# Patient Record
Sex: Female | Born: 1965 | Hispanic: No | Marital: Married | Smoking: Never smoker
Health system: Southern US, Community
[De-identification: ages and names within clinical notes are randomized; demographics above are authoritative.]

## PROBLEM LIST (undated history)

## (undated) DIAGNOSIS — G43909 Migraine, unspecified, not intractable, without status migrainosus: Secondary | ICD-10-CM

## (undated) DIAGNOSIS — C50919 Malignant neoplasm of unspecified site of unspecified female breast: Secondary | ICD-10-CM

## (undated) DIAGNOSIS — D649 Anemia, unspecified: Secondary | ICD-10-CM

## (undated) DIAGNOSIS — Z803 Family history of malignant neoplasm of breast: Secondary | ICD-10-CM

## (undated) DIAGNOSIS — J45909 Unspecified asthma, uncomplicated: Secondary | ICD-10-CM

## (undated) HISTORY — DX: Migraine, unspecified, not intractable, without status migrainosus: G43.909

## (undated) HISTORY — DX: Malignant neoplasm of unspecified site of unspecified female breast: C50.919

## (undated) HISTORY — DX: Family history of malignant neoplasm of breast: Z80.3

## (undated) HISTORY — DX: Unspecified asthma, uncomplicated: J45.909

---

## 2010-03-10 ENCOUNTER — Encounter: Admission: RE | Admit: 2010-03-10 | Discharge: 2010-03-10 | Payer: Self-pay | Admitting: Radiology

## 2010-05-20 ENCOUNTER — Other Ambulatory Visit: Admission: RE | Admit: 2010-05-20 | Discharge: 2010-05-20 | Payer: Self-pay | Admitting: Family Medicine

## 2010-05-20 ENCOUNTER — Encounter: Admission: RE | Admit: 2010-05-20 | Discharge: 2010-05-20 | Payer: Self-pay | Admitting: Family Medicine

## 2011-04-13 ENCOUNTER — Other Ambulatory Visit: Payer: Self-pay | Admitting: Family Medicine

## 2011-04-13 DIAGNOSIS — M542 Cervicalgia: Secondary | ICD-10-CM

## 2011-04-18 ENCOUNTER — Ambulatory Visit
Admission: RE | Admit: 2011-04-18 | Discharge: 2011-04-18 | Disposition: A | Discharge status: Home / Self Care | Payer: Managed Care, Other (non HMO) | Source: Ambulatory Visit | Attending: Family Medicine | Admitting: Family Medicine

## 2011-04-18 DIAGNOSIS — M542 Cervicalgia: Secondary | ICD-10-CM

## 2011-06-16 ENCOUNTER — Other Ambulatory Visit (HOSPITAL_COMMUNITY)
Admission: RE | Admit: 2011-06-16 | Discharge: 2011-06-16 | Disposition: A | Discharge status: Home / Self Care | Payer: Managed Care, Other (non HMO) | Source: Ambulatory Visit | Attending: Family Medicine | Admitting: Family Medicine

## 2011-06-16 ENCOUNTER — Other Ambulatory Visit: Payer: Self-pay | Admitting: Family Medicine

## 2011-06-16 DIAGNOSIS — Z113 Encounter for screening for infections with a predominantly sexual mode of transmission: Secondary | ICD-10-CM | POA: Insufficient documentation

## 2011-06-16 DIAGNOSIS — Z124 Encounter for screening for malignant neoplasm of cervix: Secondary | ICD-10-CM | POA: Insufficient documentation

## 2012-03-30 ENCOUNTER — Ambulatory Visit: Payer: BC Managed Care – PPO | Attending: Neurology | Admitting: *Deleted

## 2012-03-30 DIAGNOSIS — R42 Dizziness and giddiness: Secondary | ICD-10-CM | POA: Insufficient documentation

## 2012-03-30 DIAGNOSIS — IMO0001 Reserved for inherently not codable concepts without codable children: Secondary | ICD-10-CM | POA: Insufficient documentation

## 2012-04-04 ENCOUNTER — Ambulatory Visit: Payer: BC Managed Care – PPO | Admitting: Rehabilitative and Restorative Service Providers"

## 2013-06-12 ENCOUNTER — Other Ambulatory Visit: Payer: Self-pay | Admitting: Family Medicine

## 2013-06-12 DIAGNOSIS — R109 Unspecified abdominal pain: Secondary | ICD-10-CM

## 2013-06-19 ENCOUNTER — Ambulatory Visit
Admission: RE | Admit: 2013-06-19 | Discharge: 2013-06-19 | Disposition: A | Discharge status: Home / Self Care | Payer: BC Managed Care – PPO | Source: Ambulatory Visit | Attending: Family Medicine | Admitting: Family Medicine

## 2013-06-19 DIAGNOSIS — R109 Unspecified abdominal pain: Secondary | ICD-10-CM

## 2014-01-03 ENCOUNTER — Other Ambulatory Visit (HOSPITAL_COMMUNITY): Payer: Self-pay | Admitting: Family Medicine

## 2014-01-03 ENCOUNTER — Ambulatory Visit (HOSPITAL_COMMUNITY): Payer: 59 | Attending: Cardiology | Admitting: Radiology

## 2014-01-03 ENCOUNTER — Ambulatory Visit (INDEPENDENT_AMBULATORY_CARE_PROVIDER_SITE_OTHER): Payer: 59 | Admitting: Physician Assistant

## 2014-01-03 ENCOUNTER — Other Ambulatory Visit: Payer: Self-pay | Admitting: *Deleted

## 2014-01-03 ENCOUNTER — Other Ambulatory Visit: Payer: Self-pay | Admitting: Family Medicine

## 2014-01-03 ENCOUNTER — Encounter: Payer: BC Managed Care – PPO | Admitting: Physician Assistant

## 2014-01-03 ENCOUNTER — Ambulatory Visit
Admission: RE | Admit: 2014-01-03 | Discharge: 2014-01-03 | Disposition: A | Discharge status: Home / Self Care | Payer: 59 | Source: Ambulatory Visit | Attending: Family Medicine | Admitting: Family Medicine

## 2014-01-03 DIAGNOSIS — I059 Rheumatic mitral valve disease, unspecified: Secondary | ICD-10-CM | POA: Insufficient documentation

## 2014-01-03 DIAGNOSIS — R079 Chest pain, unspecified: Secondary | ICD-10-CM

## 2014-01-03 DIAGNOSIS — R0989 Other specified symptoms and signs involving the circulatory and respiratory systems: Secondary | ICD-10-CM | POA: Insufficient documentation

## 2014-01-03 DIAGNOSIS — R072 Precordial pain: Secondary | ICD-10-CM

## 2014-01-03 DIAGNOSIS — R0602 Shortness of breath: Secondary | ICD-10-CM

## 2014-01-03 DIAGNOSIS — R0609 Other forms of dyspnea: Secondary | ICD-10-CM | POA: Insufficient documentation

## 2014-01-03 DIAGNOSIS — R002 Palpitations: Secondary | ICD-10-CM | POA: Insufficient documentation

## 2014-01-03 DIAGNOSIS — I079 Rheumatic tricuspid valve disease, unspecified: Secondary | ICD-10-CM | POA: Insufficient documentation

## 2014-01-03 DIAGNOSIS — R609 Edema, unspecified: Secondary | ICD-10-CM | POA: Insufficient documentation

## 2014-01-03 NOTE — Progress Notes (Signed)
Exercise Treadmill Test  Michelle Adams is a 48 y.o. female non-smoker with no hx of CAD, DM, HTN, HL referred by PCP for CP and SOB.  Exam unremarkable.  ECG: NSR, no ST changes.  Pre-Exercise Testing Evaluation Rhythm: normal sinus  Rate: 68 bpm     Test  Exercise Tolerance Test Ordering MD: Loralie Champagne, MD  Interpreting MD: Richardson Dopp, PA-C  Unique Test No: 1  Treadmill:  1  Indication for ETT: chest pain - rule out ischemia  Contraindication to ETT: No   Stress Modality: exercise - treadmill  Cardiac Imaging Performed: non   Protocol: standard Bruce - maximal  Max BP:  153/69  Max MPHR (bpm):  175 85% MPR (bpm):  149  MPHR obtained (bpm):  151 % MPHR obtained:  86  Reached 85% MPHR (min:sec):  7:15 Total Exercise Time (min-sec):  8:00  Workload in METS:  10.1 Borg Scale: 17  Reason ETT Terminated:  patient's desire to stop    ST Segment Analysis At Rest: normal ST segments - no evidence of significant ST depression With Exercise: non-specific ST changes  Other Information Arrhythmia:  No Angina during ETT:  absent (0) Quality of ETT:  diagnostic  ETT Interpretation:  normal - no evidence of ischemia by ST analysis  Comments: Good exercise capacity. No chest pain. Normal BP response to exercise. Non-specific ST changes.  Increased artifact in lead V6 limits interpretation somewhat. No significant changes to suggest ischemia.  Overall low risk ETT.  Recommendations: F/u with PCP as planned. Signed,  Richardson Dopp, PA-C   01/03/2014 11:20 AM

## 2014-01-03 NOTE — Progress Notes (Signed)
Echocardiogram performed.  

## 2014-04-24 ENCOUNTER — Encounter: Payer: Self-pay | Admitting: Certified Nurse Midwife

## 2014-12-07 ENCOUNTER — Emergency Department (HOSPITAL_COMMUNITY): Payer: 59

## 2014-12-07 ENCOUNTER — Encounter (HOSPITAL_COMMUNITY): Payer: Self-pay | Admitting: *Deleted

## 2014-12-07 ENCOUNTER — Emergency Department (HOSPITAL_COMMUNITY)
Admission: EM | Admit: 2014-12-07 | Discharge: 2014-12-07 | Disposition: A | Discharge status: Home / Self Care | Payer: 59 | Attending: Emergency Medicine | Admitting: Emergency Medicine

## 2014-12-07 DIAGNOSIS — Z3202 Encounter for pregnancy test, result negative: Secondary | ICD-10-CM | POA: Insufficient documentation

## 2014-12-07 DIAGNOSIS — J3489 Other specified disorders of nose and nasal sinuses: Secondary | ICD-10-CM | POA: Insufficient documentation

## 2014-12-07 DIAGNOSIS — R0789 Other chest pain: Secondary | ICD-10-CM | POA: Diagnosis not present

## 2014-12-07 DIAGNOSIS — J9811 Atelectasis: Secondary | ICD-10-CM | POA: Insufficient documentation

## 2014-12-07 DIAGNOSIS — F419 Anxiety disorder, unspecified: Secondary | ICD-10-CM | POA: Insufficient documentation

## 2014-12-07 DIAGNOSIS — R0600 Dyspnea, unspecified: Secondary | ICD-10-CM | POA: Insufficient documentation

## 2014-12-07 DIAGNOSIS — Z862 Personal history of diseases of the blood and blood-forming organs and certain disorders involving the immune mechanism: Secondary | ICD-10-CM | POA: Insufficient documentation

## 2014-12-07 DIAGNOSIS — R0981 Nasal congestion: Secondary | ICD-10-CM | POA: Diagnosis not present

## 2014-12-07 DIAGNOSIS — R079 Chest pain, unspecified: Secondary | ICD-10-CM | POA: Diagnosis present

## 2014-12-07 HISTORY — DX: Anemia, unspecified: D64.9

## 2014-12-07 LAB — I-STAT VENOUS BLOOD GAS, ED
ACID-BASE DEFICIT: 3 mmol/L — AB (ref 0.0–2.0)
BICARBONATE: 24.3 meq/L — AB (ref 20.0–24.0)
O2 SAT: 54 %
PH VEN: 7.305 — AB (ref 7.250–7.300)
PO2 VEN: 32 mmHg (ref 30.0–45.0)
TCO2: 26 mmol/L (ref 0–100)
pCO2, Ven: 48.8 mmHg (ref 45.0–50.0)

## 2014-12-07 LAB — URINALYSIS, ROUTINE W REFLEX MICROSCOPIC
Bilirubin Urine: NEGATIVE
Glucose, UA: NEGATIVE mg/dL
Ketones, ur: NEGATIVE mg/dL
NITRITE: NEGATIVE
PROTEIN: 30 mg/dL — AB
SPECIFIC GRAVITY, URINE: 1.003 — AB (ref 1.005–1.030)
Urobilinogen, UA: 0.2 mg/dL (ref 0.0–1.0)
pH: 6.5 (ref 5.0–8.0)

## 2014-12-07 LAB — BASIC METABOLIC PANEL
ANION GAP: 7 (ref 5–15)
BUN: 8 mg/dL (ref 6–20)
CALCIUM: 9.1 mg/dL (ref 8.9–10.3)
CO2: 26 mmol/L (ref 22–32)
CREATININE: 0.73 mg/dL (ref 0.44–1.00)
Chloride: 102 mmol/L (ref 101–111)
GFR calc Af Amer: >60 mL/min (ref >60)
GFR calc non Af Amer: >60 mL/min (ref >60)
GLUCOSE: 100 mg/dL — AB (ref 65–99)
POTASSIUM: 3.5 mmol/L (ref 3.5–5.1)
SODIUM: 135 mmol/L (ref 135–145)

## 2014-12-07 LAB — CBC
HCT: 37.2 % (ref 36.0–46.0)
HEMOGLOBIN: 12.9 g/dL (ref 12.0–15.0)
MCH: 29.1 pg (ref 26.0–34.0)
MCHC: 34.7 g/dL (ref 30.0–36.0)
MCV: 84 fL (ref 78.0–100.0)
Platelets: 163 10*3/uL (ref 150–400)
RBC: 4.43 MIL/uL (ref 3.87–5.11)
RDW: 13.1 % (ref 11.5–15.5)
WBC: 4.4 10*3/uL (ref 4.0–10.5)

## 2014-12-07 LAB — URINE MICROSCOPIC-ADD ON

## 2014-12-07 LAB — I-STAT TROPONIN, ED
Troponin i, poc: 0 ng/mL (ref 0.00–0.08)
Troponin i, poc: 0 ng/mL (ref 0.00–0.08)

## 2014-12-07 LAB — I-STAT BETA HCG BLOOD, ED (MC, WL, AP ONLY)

## 2014-12-07 LAB — BRAIN NATRIURETIC PEPTIDE: B NATRIURETIC PEPTIDE 5: 8.1 pg/mL (ref 0.0–100.0)

## 2014-12-07 MED ORDER — LORAZEPAM 1 MG PO TABS: 1.0000 mg | ORAL_TABLET | Freq: Three times a day (TID) | ORAL | Status: DC | PRN

## 2014-12-07 MED ORDER — ALBUTEROL SULFATE HFA 108 (90 BASE) MCG/ACT IN AERS
2.0000 | INHALATION_SPRAY | RESPIRATORY_TRACT | Status: DC | PRN
  Administered 2014-12-07: 2 via RESPIRATORY_TRACT
  Filled 2014-12-07: qty 6.7

## 2014-12-07 MED ORDER — LORAZEPAM 1 MG PO TABS
1.0000 mg | ORAL_TABLET | Freq: Once | ORAL | Status: AC
  Administered 2014-12-07: 1 mg via ORAL
  Filled 2014-12-07: qty 1

## 2014-12-07 NOTE — ED Notes (Signed)
Patient transported to X-ray 

## 2014-12-07 NOTE — ED Provider Notes (Signed)
CSN: [id number]     Arrival date & time 12/07/14  1233 History   First MD Initiated Contact with Patient 12/07/14 1423     Chief Complaint  Patient presents with  . Chest Pain     (Consider location/radiation/quality/duration/timing/severity/associated sxs/prior Treatment) HPI 49 year old female who comes in today saying she has had some dyspnea since last weekend. She feels like she has some pressure diffusely. There are no worsening or decreasing factors. She has had some similar symptoms with an anxiety attack in the past. She denies any cough or fevers but she has had some nasal congestion and rhinorrhea. She denies smoking. She has a history of anemia area she denies any history of DVT, lateralized swelling, or hemoptysis. Past Medical History  Diagnosis Date  . Anemia    No past surgical history on file. No family history on file. History  Substance Use Topics  . Smoking status: Not on file  . Smokeless tobacco: Not on file  . Alcohol Use: No   OB History    No data available     Review of Systems  All other systems reviewed and are negative.     Allergies  Review of patient's allergies indicates no known allergies.  Home Medications   Prior to Admission medications   Not on File   BP 106/59 mmHg  Pulse 73  Temp(Src) 98.5 F (36.9 C) (Oral)  Resp 13  Ht 5' 1.5" (1.562 m)  Wt 150 lb (68.04 kg)  BMI 27.89 kg/m2  SpO2 100%  LMP 12/04/2014 Physical Exam  Constitutional: She is oriented to person, place, and time. She appears well-developed and well-nourished.  HENT:  Head: Normocephalic and atraumatic.  Eyes: Conjunctivae and EOM are normal. Pupils are equal, round, and reactive to light.  Neck: Normal range of motion. Neck supple.  Cardiovascular: Normal rate, regular rhythm, normal heart sounds and intact distal pulses.   Pulmonary/Chest: Effort normal and breath sounds normal.  Abdominal: Soft. Bowel sounds are normal.  Musculoskeletal: Normal range of  motion. She exhibits no edema or tenderness.  Neurological: She is alert and oriented to person, place, and time. She has normal reflexes.  Skin: Skin is warm and dry.  Psychiatric: She has a normal mood and affect. Her behavior is normal. Judgment and thought content normal.  Nursing note and vitals reviewed.   ED Course  Procedures (including critical care time) Labs Review Labs Reviewed  BASIC METABOLIC PANEL - Abnormal; Notable for the following:    Glucose, Bld 100 (*)    All other components within normal limits  I-STAT VENOUS BLOOD GAS, ED - Abnormal; Notable for the following:    pH, Ven 7.305 (*)    Bicarbonate 24.3 (*)    Acid-base deficit 3.0 (*)    All other components within normal limits  URINE CULTURE  CBC  BRAIN NATRIURETIC PEPTIDE  URINALYSIS, ROUTINE W REFLEX MICROSCOPIC (NOT AT [facility])  I-STAT TROPOININ, ED  I-STAT BETA HCG BLOOD, ED ([facility], [facility], [facility] ONLY)  I-STAT TROPOININ, ED    Imaging Review Dg Chest 2 View  12/07/2014   CLINICAL DATA:  Palpitations.  Chest heaviness and tightness.  EXAM: CHEST  2 VIEW  COMPARISON:  01/03/2014  FINDINGS: Grossly unchanged cardiac silhouette and mediastinal contours. Grossly unchanged minimal left basilar heterogeneous opacities favored to represent atelectasis or scar. No new focal airspace opacities. No pleural effusion or pneumothorax. No evidence of edema. No acute osseus abnormalities.  IMPRESSION: Left basilar atelectasis/scar without acute cardiopulmonary disease.  Electronically Signed   By: Sandi Mariscal M.D.   On: 12/07/2014 13:22     EKG Interpretation   Date/Time:  Saturday December 07 2014 12:40:34 EDT Ventricular Rate:  69 PR Interval:  122 QRS Duration: 80 QT Interval:  400 QTC Calculation: 428 R Axis:   60 Text Interpretation:  Normal sinus rhythm Normal ECG Confirmed by Kmari Brian MD,  Andee Poles ([id number]) on 12/07/2014 2:23:28 PM      MDM   Final diagnoses:  Atypical chest pain  Atelectasis, left    49 year old  female with atypical chest pain. I have a low index of suspicion for coronary artery disease given the type of pain that the patient describes. She is perked negative for PE. He should given albuterol nebulizer here in emergency department. Patient with focal atelectasis at left base.  She had a delta troponin is normal. EKG is normal. I have discussed return precautions with the patient and her family. I have discussed need for close follow-up. They voice understanding.   Pattricia Boss, MD 12/07/14 [phone]

## 2014-12-07 NOTE — Discharge Instructions (Signed)
Your workup here reveals a normal EKG, normal labs including heart enzymes done at 2 points in time. Chest x-Yarieliz Wasser reveals some mild atelectasis at the left base. Please follow-up with your primary care doctor. Return to the emergency department if you have return chest pain or worsening symptoms. Your laboratory values and evaluation here are very reassuring that there is no acute problem going on right now.  Chest Pain (Nonspecific) It is often hard to give a diagnosis for the cause of chest pain. There is always a chance that your pain could be related to something serious, such as a heart attack or a blood clot in the lungs. You need to follow up with your doctor. HOME CARE  If antibiotic medicine was given, take it as directed by your doctor. Finish the medicine even if you start to feel better.  For the next few days, avoid activities that bring on chest pain. Continue physical activities as told by your doctor.  Do not use any tobacco products. This includes cigarettes, chewing tobacco, and e-cigarettes.  Avoid drinking alcohol.  Only take medicine as told by your doctor.  Follow your doctor's suggestions for more testing if your chest pain does not go away.  Keep all doctor visits you made. GET HELP IF:  Your chest pain does not go away, even after treatment.  You have a rash with blisters on your chest.  You have a fever. GET HELP RIGHT AWAY IF:   You have more pain or pain that spreads to your arm, neck, jaw, back, or belly (abdomen).  You have shortness of breath.  You cough more than usual or cough up blood.  You have very bad back or belly pain.  You feel sick to your stomach (nauseous) or throw up (vomit).  You have very bad weakness.  You pass out (faint).  You have chills. This is an emergency. Do not wait to see if the problems will go away. Call your local emergency services (911 in [place] Do not drive yourself to the hospital. MAKE SURE YOU:   Understand  these instructions.  Will watch your condition.  Will get help right away if you are not doing well or get worse. Document Released: 12/08/2007 Document Revised: 06/26/2013 Document Reviewed: 12/08/2007 [facility] Patient Information 2015 [place], [place]. This information is not intended to replace advice given to you by your health care provider. Make sure you discuss any questions you have with your health care provider.

## 2014-12-07 NOTE — ED Notes (Signed)
Returned from [employer] [facility].  Up to BR.

## 2014-12-07 NOTE — ED Notes (Signed)
Pt reports mid chest heaviness and sob since yesterday. Denies nausea or recent cough. No acute distress noted at triage, ekg done.

## 2014-12-08 LAB — URINE CULTURE: Colony Count: 30000

## 2016-04-02 ENCOUNTER — Other Ambulatory Visit: Payer: Self-pay | Admitting: Family Medicine

## 2016-04-02 ENCOUNTER — Other Ambulatory Visit (HOSPITAL_COMMUNITY)
Admission: RE | Admit: 2016-04-02 | Discharge: 2016-04-02 | Disposition: A | Discharge status: Home / Self Care | Payer: 59 | Source: Ambulatory Visit | Attending: Family Medicine | Admitting: Family Medicine

## 2016-04-02 ENCOUNTER — Other Ambulatory Visit (HOSPITAL_COMMUNITY): Admission: RE | Admit: 2016-04-02 | Payer: 59 | Source: Ambulatory Visit

## 2016-04-02 DIAGNOSIS — Z124 Encounter for screening for malignant neoplasm of cervix: Secondary | ICD-10-CM | POA: Diagnosis present

## 2016-04-05 LAB — CYTOLOGY - PAP

## 2016-04-29 ENCOUNTER — Encounter: Payer: Self-pay | Admitting: Vascular Surgery

## 2016-05-04 ENCOUNTER — Encounter: Payer: Self-pay | Admitting: Vascular Surgery

## 2016-05-04 ENCOUNTER — Ambulatory Visit (INDEPENDENT_AMBULATORY_CARE_PROVIDER_SITE_OTHER): Payer: 59 | Admitting: Vascular Surgery

## 2016-05-04 ENCOUNTER — Encounter (INDEPENDENT_AMBULATORY_CARE_PROVIDER_SITE_OTHER): Payer: Self-pay

## 2016-05-04 ENCOUNTER — Other Ambulatory Visit: Payer: Self-pay | Admitting: Family Medicine

## 2016-05-04 VITALS — BP 118/77 | HR 69 | Temp 98.0°F | Resp 14 | Ht 62.0 in | Wt 164.0 lb

## 2016-05-04 DIAGNOSIS — R233 Spontaneous ecchymoses: Secondary | ICD-10-CM

## 2016-05-04 DIAGNOSIS — R1011 Right upper quadrant pain: Secondary | ICD-10-CM

## 2016-05-04 NOTE — Progress Notes (Signed)
Subjective:     Patient ID: Michelle Adams, female   DOB: 25-Feb-1966, 50 y.o.   MRN: [id number]  HPI This 50 year old female was referred by Dr. Maurice Small for evaluation of spontaneous bruising and rule out venous disease. Patient states that she has had spontaneous bruises occur over the past 10 years intermittently usually in conjunction with her menstrual cycle. Recently she has had 2 spontaneous bruises 1 which was extensive on the medial aspect of her right foot on October 4 and a second episode in the right shin area. She states there was absolutely no trauma to these areas. She's also had some of this in the upper extremities. He's had no history of GI bleeding other than some rectal bleeding from hemorrhoids. She denies any previous history of bleeding disorders. She has no history of DVT thrombophlebitis stasis ulcers. She does develop some mild swelling as the day progresses. She does not or elastic compression stockings.  Past Medical History:  Diagnosis Date  . Anemia     Social History  Substance Use Topics  . Smoking status: Never Smoker  . Smokeless tobacco: Never Used  . Alcohol use No    Family History  Problem Relation Age of Onset  . Hypertension Mother   . Hyperlipidemia Mother   . Arthritis Mother   . Cancer Father   . Diabetes Father     No Known Allergies   Current Outpatient Prescriptions:  .  ferrous gluconate (FERGON) 324 MG tablet, TAKE AS DIRECTED TWICE A DAY, Disp: , Rfl: 0 .  JUNEL 1/20 1-20 MG-MCG tablet, , Disp: , Rfl:  .  LORazepam (ATIVAN) 1 MG tablet, Take 1 tablet (1 mg total) by mouth every 8 (eight) hours as needed for anxiety., Disp: 3 tablet, Rfl: 0 .  valACYclovir (VALTREX) 1000 MG tablet, , Disp: , Rfl:   Vitals:   05/04/16 1124 05/04/16 1127  BP: 126/79 118/77  Pulse: 69 69  Resp: 14   Temp: 98 F (36.7 C)   SpO2: 100%   Weight: 164 lb (74.4 kg)   Height: 5\' 2"  (1.575 m)     Body mass index is 30 kg/m.         Review  of Systems Denies chest pain but does occasionally have some mild dyspnea on exertion. Has history of asthma, benign left breast mass, migraine headaches. Does have history of hemorrhoids as noted in history of present illness. Other systems negative and complete review of systems    Objective:   Physical Exam BP 118/77 (BP Location: Right Arm, Patient Position: Sitting, Cuff Size: Normal)   Pulse 69   Temp 98 F (36.7 C)   Resp 14   Ht 5\' 2"  (1.575 m)   Wt 164 lb (74.4 kg)   SpO2 100%   BMI 30.00 kg/m     Gen.-alert and oriented x3 in no apparent distress HEENT normal for age Lungs no rhonchi or wheezing Cardiovascular regular rhythm no murmurs carotid pulses 3+ palpable no bruits audible Abdomen soft nontender no palpable masses Musculoskeletal free of  major deformities Skin clear -no rashes Neurologic normal Lower extremities 3+ femoral and dorsalis pedis pulses palpable bilaterally with no edema Right pretibial region has area of bruising which appears to be a subcutaneous hematoma about 3-4 cm in diameter in the mid tibia region. Another area of bruising is in the left ankle area near the medial malleolus. No bulging varicosities spider veins or reticular veins noted in either leg. No hyperpigmentation or  ulceration noted. No stigmata of chronic venous insufficiency noted.  Today performed a bedside SonoSite ultrasound exam. I examined the great saphenous veins bilaterally which are normal in appearance with no evidence of reflux with normal caliber.       Assessment:     #1 history of spontaneous bruising in upper and lower extremities usually associated with menstrual cycle-etiology unknown-photographs of her foot when this occurred in October were impressive #2 history of rectal bleeding thought to be from hemorrhoids #3 history of benign left breast mass-  #4 asthma #5 Vertigo    Plan:     No evidence of venous insufficiency or varicose veins-no need for further  arterial or venous workup Would recommend evaluation by hematologist looking for some type of bleeding disorder since she is clearly having episodes of spontaneous bleeding with no known etiology

## 2016-05-26 ENCOUNTER — Encounter: Payer: 59 | Admitting: Vascular Surgery

## 2016-05-26 ENCOUNTER — Encounter (HOSPITAL_COMMUNITY): Payer: 59

## 2016-05-26 ENCOUNTER — Ambulatory Visit
Admission: RE | Admit: 2016-05-26 | Discharge: 2016-05-26 | Disposition: A | Discharge status: Home / Self Care | Payer: 59 | Source: Ambulatory Visit | Attending: Family Medicine | Admitting: Family Medicine

## 2016-05-26 DIAGNOSIS — R1011 Right upper quadrant pain: Secondary | ICD-10-CM

## 2016-06-21 ENCOUNTER — Encounter: Payer: 59 | Admitting: Vascular Surgery

## 2016-10-22 ENCOUNTER — Ambulatory Visit (INDEPENDENT_AMBULATORY_CARE_PROVIDER_SITE_OTHER): Payer: 59 | Admitting: Obstetrics and Gynecology

## 2016-10-22 ENCOUNTER — Encounter: Payer: Self-pay | Admitting: Obstetrics and Gynecology

## 2016-10-22 VITALS — BP 126/68 | HR 72 | Wt 165.1 lb

## 2016-10-22 DIAGNOSIS — N938 Other specified abnormal uterine and vaginal bleeding: Secondary | ICD-10-CM | POA: Diagnosis not present

## 2016-10-22 DIAGNOSIS — R238 Other skin changes: Secondary | ICD-10-CM

## 2016-10-22 DIAGNOSIS — R233 Spontaneous ecchymoses: Secondary | ICD-10-CM

## 2016-10-22 MED ORDER — BUTALBITAL-APAP-CAFFEINE 50-325-40 MG PO CAPS: 1.0000 | ORAL_CAPSULE | Freq: Four times a day (QID) | ORAL | 3 refills | Status: DC | PRN

## 2016-10-22 NOTE — Patient Instructions (Signed)
Medroxyprogesterone injection [Contraceptive] What is this medicine? MEDROXYPROGESTERONE (me DROX ee proe JES te rone) contraceptive injections prevent pregnancy. They provide effective birth control for 3 months. Depo-subQ Provera 104 is also used for treating pain related to endometriosis. This medicine may be used for other purposes; ask your health care provider or pharmacist if you have questions. COMMON BRAND NAME(S): Depo-Provera, Depo-subQ Provera 104 What should I tell my health care provider before I take this medicine? They need to know if you have any of these conditions: -frequently drink alcohol -asthma -blood vessel disease or a history of a blood clot in the lungs or legs -bone disease such as osteoporosis -breast cancer -diabetes -eating disorder (anorexia nervosa or bulimia) -high blood pressure -HIV infection or AIDS -kidney disease -liver disease -mental depression -migraine -seizures (convulsions) -stroke -tobacco smoker -vaginal bleeding -an unusual or allergic reaction to medroxyprogesterone, other hormones, medicines, foods, dyes, or preservatives -pregnant or trying to get pregnant -breast-feeding How should I use this medicine? Depo-Provera Contraceptive injection is given into a muscle. Depo-subQ Provera 104 injection is given under the skin. These injections are given by a health care professional. You must not be pregnant before getting an injection. The injection is usually given during the first 5 days after the start of a menstrual period or 6 weeks after delivery of a baby. Talk to your pediatrician regarding the use of this medicine in children. Special care may be needed. These injections have been used in female children who have started having menstrual periods. Overdosage: If you think you have taken too much of this medicine contact a poison control center or emergency room at once. NOTE: This medicine is only for you. Do not share this medicine  with others. What if I miss a dose? Try not to miss a dose. You must get an injection once every 3 months to maintain birth control. If you cannot keep an appointment, call and reschedule it. If you wait longer than 13 weeks between Depo-Provera contraceptive injections or longer than 14 weeks between Depo-subQ Provera 104 injections, you could get pregnant. Use another method for birth control if you miss your appointment. You may also need a pregnancy test before receiving another injection. What may interact with this medicine? Do not take this medicine with any of the following medications: -bosentan This medicine may also interact with the following medications: -aminoglutethimide -antibiotics or medicines for infections, especially rifampin, rifabutin, rifapentine, and griseofulvin -aprepitant -barbiturate medicines such as phenobarbital or primidone -bexarotene -carbamazepine -medicines for seizures like ethotoin, felbamate, oxcarbazepine, phenytoin, topiramate -modafinil -St. John's wort This list may not describe all possible interactions. Give your health care provider a list of all the medicines, herbs, non-prescription drugs, or dietary supplements you use. Also tell them if you smoke, drink alcohol, or use illegal drugs. Some items may interact with your medicine. What should I watch for while using this medicine? This drug does not protect you against HIV infection (AIDS) or other sexually transmitted diseases. Use of this product may cause you to lose calcium from your bones. Loss of calcium may cause weak bones (osteoporosis). Only use this product for more than 2 years if other forms of birth control are not right for you. The longer you use this product for birth control the more likely you will be at risk for weak bones. Ask your health care professional how you can keep strong bones. You may have a change in bleeding pattern or irregular periods. Many females stop having    periods while taking this drug. If you have received your injections on time, your chance of being pregnant is very low. If you think you may be pregnant, see your health care professional as soon as possible. Tell your health care professional if you want to get pregnant within the next year. The effect of this medicine may last a long time after you get your last injection. What side effects may I notice from receiving this medicine? Side effects that you should report to your doctor or health care professional as soon as possible: -allergic reactions like skin rash, itching or hives, swelling of the face, lips, or tongue -breast tenderness or discharge -breathing problems -changes in vision -depression -feeling faint or lightheaded, falls -fever -pain in the abdomen, chest, groin, or leg -problems with balance, talking, walking -unusually weak or tired -yellowing of the eyes or skin Side effects that usually do not require medical attention (report to your doctor or health care professional if they continue or are bothersome): -acne -fluid retention and swelling -headache -irregular periods, spotting, or absent periods -temporary pain, itching, or skin reaction at site where injected -weight gain This list may not describe all possible side effects. Call your doctor for medical advice about side effects. You may report side effects to FDA at 1-800-FDA-1088. Where should I keep my medicine? This does not apply. The injection will be given to you by a health care professional. NOTE: This sheet is a summary. It may not cover all possible information. If you have questions about this medicine, talk to your doctor, pharmacist, or health care provider.  2018 Elsevier/Gold Standard (2008-07-12 18:37:56)   Levonorgestrel intrauterine device (IUD) What is this medicine? LEVONORGESTREL IUD (LEE voe nor jes trel) is a contraceptive (birth control) device. The device is placed inside the  uterus by a healthcare professional. It is used to prevent pregnancy. This device can also be used to treat heavy bleeding that occurs during your period. This medicine may be used for other purposes; ask your health care provider or pharmacist if you have questions. COMMON BRAND NAME(S): Kyleena, LILETTA, Mirena, Skyla What should I tell my health care provider before I take this medicine? They need to know if you have any of these conditions: -abnormal Pap smear -cancer of the breast, uterus, or cervix -diabetes -endometritis -genital or pelvic infection now or in the past -have more than one sexual partner or your partner has more than one partner -heart disease -history of an ectopic or tubal pregnancy -immune system problems -IUD in place -liver disease or tumor -problems with blood clots or take blood-thinners -seizures -use intravenous drugs -uterus of unusual shape -vaginal bleeding that has not been explained -an unusual or allergic reaction to levonorgestrel, other hormones, silicone, or polyethylene, medicines, foods, dyes, or preservatives -pregnant or trying to get pregnant -breast-feeding How should I use this medicine? This device is placed inside the uterus by a health care professional. Talk to your pediatrician regarding the use of this medicine in children. Special care may be needed. Overdosage: If you think you have taken too much of this medicine contact a poison control center or emergency room at once. NOTE: This medicine is only for you. Do not share this medicine with others. What if I miss a dose? This does not apply. Depending on the brand of device you have inserted, the device will need to be replaced every 3 to 5 years if you wish to continue using this type of birth control. What   may interact with this medicine? Do not take this medicine with any of the following medications: -amprenavir -bosentan -fosamprenavir This medicine may also interact with  the following medications: -aprepitant -armodafinil -barbiturate medicines for inducing sleep or treating seizures -bexarotene -boceprevir -griseofulvin -medicines to treat seizures like carbamazepine, ethotoin, felbamate, oxcarbazepine, phenytoin, topiramate -modafinil -pioglitazone -rifabutin -rifampin -rifapentine -some medicines to treat HIV infection like atazanavir, efavirenz, indinavir, lopinavir, nelfinavir, tipranavir, ritonavir -St. John's wort -warfarin This list may not describe all possible interactions. Give your health care provider a list of all the medicines, herbs, non-prescription drugs, or dietary supplements you use. Also tell them if you smoke, drink alcohol, or use illegal drugs. Some items may interact with your medicine. What should I watch for while using this medicine? Visit your doctor or health care professional for regular check ups. See your doctor if you or your partner has sexual contact with others, becomes HIV positive, or gets a sexual transmitted disease. This product does not protect you against HIV infection (AIDS) or other sexually transmitted diseases. You can check the placement of the IUD yourself by reaching up to the top of your vagina with clean fingers to feel the threads. Do not pull on the threads. It is a good habit to check placement after each menstrual period. Call your doctor right away if you feel more of the IUD than just the threads or if you cannot feel the threads at all. The IUD may come out by itself. You may become pregnant if the device comes out. If you notice that the IUD has come out use a backup birth control method like condoms and call your health care provider. Using tampons will not change the position of the IUD and are okay to use during your period. This IUD can be safely scanned with magnetic resonance imaging (MRI) only under specific conditions. Before you have an MRI, tell your healthcare provider that you have an  IUD in place, and which type of IUD you have in place. What side effects may I notice from receiving this medicine? Side effects that you should report to your doctor or health care professional as soon as possible: -allergic reactions like skin rash, itching or hives, swelling of the face, lips, or tongue -fever, flu-like symptoms -genital sores -high blood pressure -no menstrual period for 6 weeks during use -pain, swelling, warmth in the leg -pelvic pain or tenderness -severe or sudden headache -signs of pregnancy -stomach cramping -sudden shortness of breath -trouble with balance, talking, or walking -unusual vaginal bleeding, discharge -yellowing of the eyes or skin Side effects that usually do not require medical attention (report to your doctor or health care professional if they continue or are bothersome): -acne -breast pain -change in sex drive or performance -changes in weight -cramping, dizziness, or faintness while the device is being inserted -headache -irregular menstrual bleeding within first 3 to 6 months of use -nausea This list may not describe all possible side effects. Call your doctor for medical advice about side effects. You may report side effects to FDA at 1-800-FDA-1088. Where should I keep my medicine? This does not apply. NOTE: This sheet is a summary. It may not cover all possible information. If you have questions about this medicine, talk to your doctor, pharmacist, or health care provider.  2018 Elsevier/Gold Standard (2016-04-02 14:14:56)  

## 2016-10-22 NOTE — Progress Notes (Signed)
51 yo presenting to discuss her menstrual cycle. She reports a monthly cycle lasting 5 days. She states that prior to the onset of her cycle she experiences a migraine headache relieved at times with excedrin. She also experiences spontaneous bruising on her extremities. She states that the bruising has been present for the past 10 years and they occur on random locations. She reports bleeding while brushing her teeth but denies any complications associated with her cesarean section. She also desires permanent sterilization  Past Medical History:  Diagnosis Date  . Anemia    Past Surgical History:  Procedure Laterality Date  . CESAREAN SECTION     Family History  Problem Relation Age of Onset  . Hypertension Mother   . Hyperlipidemia Mother   . Arthritis Mother   . Cancer Father   . Diabetes Father    Social History  Substance Use Topics  . Smoking status: Never Smoker  . Smokeless tobacco: Never Used  . Alcohol use No   ROS See pertinent in HPI  Blood pressure 126/68, pulse 72, weight 165 lb 1.6 oz (74.9 kg), last menstrual period 10/04/2016. GENERAL: Well-developed, well-nourished female in no acute distress.  LUNGS: Clear to auscultation bilaterally.  HEART: Regular rate and rhythm. BREASTS: Symmetric in size. No palpable masses or lymphadenopathy, skin changes, or nipple drainage. ABDOMEN: Soft, nontender, nondistended.  EXTREMITIES: No cyanosis, clubbing, or edema, 2+ distal pulses. No bruising currently  A/P 51 yo here with several complaints 1) Patient desires permanent sterilization and will be scheduled for laparoscopic bilateral tubal ligations Other reversible forms of contraception were discussed with patient; she declines all other modalities.  Risks of procedure discussed with patient including permanence of method, bleeding, infection, injury to surrounding organs and need for additional procedures including laparotomy, risk of regret.  Failure risk of 0.5-1% with  increased risk of ectopic gestation if pregnancy occurs was also discussed with patient.   Patient desires to have the procedure in the fall  2) migraine headaches - Rx Fioricet provided  - Referral to headache specialist   3) Easy bruising - Von Willebrand panel ordered - Referral to hematology

## 2016-10-23 LAB — VON WILLEBRAND PANEL
FACTOR VIII ACTIVITY: 123 % (ref 57–163)
Von Willebrand Ag: 94 % (ref 50–200)
Von Willebrand Factor: 92 % (ref 50–200)

## 2016-10-23 LAB — LUTEINIZING HORMONE: LH: 7.8 m[IU]/mL

## 2016-10-23 LAB — COAG STUDIES INTERP REPORT

## 2016-10-23 LAB — TSH: TSH: 1.34 u[IU]/mL (ref 0.450–4.500)

## 2016-10-23 LAB — FOLLICLE STIMULATING HORMONE: FSH: 12 m[IU]/mL

## 2016-11-10 ENCOUNTER — Ambulatory Visit: Admit: 2016-11-10 | Payer: 59 | Admitting: Obstetrics and Gynecology

## 2016-11-10 SURGERY — LIGATION, FALLOPIAN TUBE, LAPAROSCOPIC
Anesthesia: Choice | Laterality: Bilateral

## 2016-11-22 ENCOUNTER — Encounter: Payer: Self-pay | Admitting: *Deleted

## 2016-11-25 ENCOUNTER — Telehealth: Payer: Self-pay | Admitting: General Practice

## 2016-11-25 MED ORDER — BUTALBITAL-APAP-CAFFEINE 50-325-40 MG PO CAPS: 1.0000 | ORAL_CAPSULE | Freq: Four times a day (QID) | ORAL | 3 refills | Status: AC | PRN

## 2016-11-25 NOTE — Telephone Encounter (Signed)
Patient called and left message stating she saw Dr Elly Modena on 4/20 and she was given a prescription for migraines. Patient states she lost her prescription and is having a migraine & would like med sent to [facility] in [place]. Med resubmitted. Called & informed patient. Patient verbalized understanding & had no questions

## 2016-12-10 ENCOUNTER — Institutional Professional Consult (permissible substitution): Payer: 59 | Admitting: Physician Assistant

## 2016-12-29 ENCOUNTER — Encounter (HOSPITAL_BASED_OUTPATIENT_CLINIC_OR_DEPARTMENT_OTHER): Payer: Self-pay

## 2016-12-29 ENCOUNTER — Ambulatory Visit (HOSPITAL_BASED_OUTPATIENT_CLINIC_OR_DEPARTMENT_OTHER): Admit: 2016-12-29 | Payer: 59 | Admitting: Obstetrics and Gynecology

## 2016-12-29 SURGERY — LIGATION, FALLOPIAN TUBE, LAPAROSCOPIC
Anesthesia: General

## 2017-01-19 ENCOUNTER — Encounter: Payer: Self-pay | Admitting: Hematology

## 2017-02-03 ENCOUNTER — Encounter: Payer: Self-pay | Admitting: Hematology

## 2017-02-03 ENCOUNTER — Ambulatory Visit (HOSPITAL_BASED_OUTPATIENT_CLINIC_OR_DEPARTMENT_OTHER): Payer: 59 | Admitting: Hematology

## 2017-02-03 ENCOUNTER — Telehealth: Payer: Self-pay | Admitting: Hematology

## 2017-02-03 VITALS — BP 123/89 | HR 71 | Temp 99.5°F | Ht 62.0 in | Wt 163.4 lb

## 2017-02-03 DIAGNOSIS — D509 Iron deficiency anemia, unspecified: Secondary | ICD-10-CM | POA: Diagnosis not present

## 2017-02-03 DIAGNOSIS — R238 Other skin changes: Secondary | ICD-10-CM

## 2017-02-03 DIAGNOSIS — R5383 Other fatigue: Secondary | ICD-10-CM

## 2017-02-03 DIAGNOSIS — R233 Spontaneous ecchymoses: Secondary | ICD-10-CM

## 2017-02-03 DIAGNOSIS — D500 Iron deficiency anemia secondary to blood loss (chronic): Secondary | ICD-10-CM

## 2017-02-03 NOTE — Telephone Encounter (Signed)
Scheduled appt per 8/2 los - Gave patient AVS and calender per los. 

## 2017-02-03 NOTE — Progress Notes (Signed)
[facility]    HEMATOLOGY/ONCOLOGY CONSULTATION NOTE  Date of Service: 02/03/2017  Patient Care Team: Maurice Small, MD as PCP - General (Family Medicine)  CHIEF COMPLAINTS/PURPOSE OF CONSULTATION:  Easy Bruisability  HISTORY OF PRESENTING ILLNESS:   Michelle Adams is a wonderful 51 y.o. female who has been referred to [place] by Dr .Maurice Small, MD  for evaluation and management of easy bruisability r/o bleeding disorder.  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 She notes that she developed spontaneous bruises measuring a few centimeters in size on her extremities especially around the time of her periods.  She does have migraine headaches for which she uses Advil or Aleve or Florinef 3-4 times a week. Has been on oral iron replacement as per her primary care physician for some iron deficiency anemia. No family history of bleeding disorders. No nosebleeds no gum bleeds no overt GI bleeding no joint bleeding no muscle hematomas. No hematuria. She has had 2 C-sections in the past with no concerns of excessive bleeding.  Other than the over-the-counter NSAIDs the patient denies being on any other blood thinners or herbal supplements.  She notes that she has been sleeping poorly and this has left her feeling fatigued.  MEDICAL HISTORY:  Past Medical History:  Diagnosis Date  . Anemia    Low iron - to take ferrous gluconate  History of migraine headaches Iron deficiency anemia History of genital herpes on Valtrex when necessary  SURGICAL HISTORY: Past Surgical History:  Procedure Laterality Date  . CESAREAN SECTION      SOCIAL HISTORY: Social History   Social History  . Marital status: Married    Spouse name: N/A  . Number of children: N/A  . Years of education: N/A   Occupational History  . Not on file.   Social History Main Topics    . Smoking status: Never Smoker  . Smokeless tobacco: Never Used  . Alcohol use Yes     Comment: social  . Drug use: No  . Sexual activity: Yes    Birth control/ protection: Condom   Other Topics Concern  . Not on file   Social History Narrative  . No narrative on file  Patient is a lifelong nonsmoker. Denies alcohol use. Works as a Engineer, manufacturing systems..  FAMILY HISTORY: Family History  Problem Relation Age of Onset  . Hypertension Mother   . Hyperlipidemia Mother   . Arthritis Mother   . Cancer Father   . Diabetes Father   . Hyperlipidemia Father   Father had multiple myeloma hypertension and diabetes  ALLERGIES:  has No Known Allergies.  MEDICATIONS:  Current Outpatient Prescriptions  Medication Sig Dispense Refill  . DiphenhydrAMINE HCl, Sleep, (UNISOM SLEEPGELS) 50 MG CAPS Take 50 mg by mouth as needed.    . Misc Natural Products (GREEN TEA) TABS Take 2 tablets by mouth 2 (two) times daily.    . Probiotic Product (PROBIOTIC PO) Take 1 tablet by mouth daily.    . Butalbital-APAP-Caffeine 50-325-40 MG capsule Take 1-2 capsules by mouth every 6 (six) hours as needed for headache. 30 capsule 3  . ferrous gluconate (FERGON) 324 MG tablet TAKE AS DIRECTED TWICE A DAY  0  . LORazepam (ATIVAN) 1 MG tablet Take 1 tablet (1 mg total) by mouth every 8 (eight) hours as needed for anxiety. 3 tablet 0  . valACYclovir (VALTREX) 1000 MG tablet  No current facility-administered medications for this visit.     REVIEW OF SYSTEMS:    10 Point review of Systems was done is negative except as noted above.  PHYSICAL EXAMINATION: ECOG PERFORMANCE STATUS: 0 - Asymptomatic  . Vitals:   02/03/17 1544  BP: 123/89  Pulse: 71  Temp: 99.5 F (37.5 C)   Filed Weights   02/03/17 1544  Weight: 163 lb 6.4 oz (74.1 kg)   .Body mass index is 29.89 kg/m.  GENERAL:alert, in no acute distress and comfortable SKIN: no acute rashes, no significant lesions EYES: conjunctiva are  pink and non-injected, sclera anicteric OROPHARYNX: MMM, no exudates, no oropharyngeal erythema or ulceration NECK: supple, no JVD LYMPH:  no palpable lymphadenopathy in the cervical, axillary or inguinal regions LUNGS: clear to auscultation b/l with normal respiratory effort HEART: regular rate & rhythm ABDOMEN:  normoactive bowel sounds , non tender, not distended. Extremity: no pedal edema PSYCH: alert & oriented x 3 with fluent speech NEURO: no focal motor/sensory deficits  LABORATORY DATA:  I have reviewed the data as listed  . CBC Latest Ref Rng & Units 12/07/2014  WBC 4.0 - 10.5 K/uL 4.4  Hemoglobin 12.0 - 15.0 g/dL 12.9  Hematocrit 36.0 - 46.0 % 37.2  Platelets 150 - 400 K/uL 163    . CMP Latest Ref Rng & Units 12/07/2014  Glucose 65 - 99 mg/dL 100(H)  BUN 6 - 20 mg/dL 8  Creatinine 0.44 - 1.00 mg/dL 0.73  Sodium 135 - 145 mmol/L 135  Potassium 3.5 - 5.1 mmol/L 3.5  Chloride 101 - 111 mmol/L 102  CO2 22 - 32 mmol/L 26  Calcium 8.9 - 10.3 mg/dL 9.1        RADIOGRAPHIC STUDIES: I have personally reviewed the radiological images as listed and agreed with the findings in the report. No results found.  ASSESSMENT & PLAN:   51 yo Filipino female with   1) Easy Bruisability Patient describes nontraumatic bruising on her extremities especially around time of her periods. A few centimeters in size. Did not have any overt bruising that was evident at today's clinic visit.  Patient notes no other etiology for trauma. No family history of bleeding disorder.  Has been using over-the-counter NSAIDs for her migraine headaches which we discussed could certainly cause her bruising. Notes that she has a balanced diet with no overt concerns nutritional deficiencies.  Has previously had 2 C-sections without any concerns with excessive bleeding - this would suggest against a significant bleeding diathesis No nosebleeds no gum bleeds no overt GI bleeding no joint bleeding no  muscle hematomas. No hematuria.  PLAN -We discussed that her clinical presentation is not strongly consistent with a inherited bleeding diathesis. -She is quite concerned and would like to rule out a bleeding disorder. -We will send out workup to rule out a bleeding disorder during CBC to check for platelet counts, platelet function assay, APTT, PT, fibrinogen and von Willebrand panel repeat. -She was recommended to reduce her NSAID use and perhaps switch to Tylenol instead of Aleve or Advil. -She was recommended to keep her extremities moisturized well and consider using a Epilator shaver as opposed to shaving  blade. -if bruising is peri-follicular could consider empirically using Vit C 564m po daily over the counter with her Iron  #2 h/o Iron deficiency Anemia -notes fatigue. Plan -will check Iron labs -continue PO iron at this time as per PCP.  Patient wants to return for labs later.  Labs tomorrow RTC based  on lab results  All of the patients questions were answered with apparent satisfaction. The patient knows to call the clinic with any problems, questions or concerns.  I spent 40 minutes counseling the patient face to face. The total time spent in the appointment was 60 minutes and more than 50% was on counseling and direct patient cares.    Sullivan Lone MD [place] A[facility]IV[facility] Hematology/Oncology Physician [facility]  (Office):       [phone] (Work cell):  [phone] (Fax):           [phone]  02/03/2017 4:30 PM

## 2017-02-04 ENCOUNTER — Other Ambulatory Visit: Payer: 59

## 2017-02-07 ENCOUNTER — Encounter (HOSPITAL_COMMUNITY): Payer: Self-pay

## 2017-03-03 ENCOUNTER — Other Ambulatory Visit (HOSPITAL_COMMUNITY)
Admission: RE | Admit: 2017-03-03 | Discharge: 2017-03-03 | Disposition: A | Discharge status: Home / Self Care | Payer: 59 | Source: Other Acute Inpatient Hospital | Attending: Hematology | Admitting: Hematology

## 2017-03-03 ENCOUNTER — Ambulatory Visit (HOSPITAL_BASED_OUTPATIENT_CLINIC_OR_DEPARTMENT_OTHER): Payer: 59

## 2017-03-03 DIAGNOSIS — D500 Iron deficiency anemia secondary to blood loss (chronic): Secondary | ICD-10-CM

## 2017-03-03 DIAGNOSIS — R238 Other skin changes: Secondary | ICD-10-CM | POA: Insufficient documentation

## 2017-03-03 DIAGNOSIS — D509 Iron deficiency anemia, unspecified: Secondary | ICD-10-CM

## 2017-03-03 DIAGNOSIS — R233 Spontaneous ecchymoses: Secondary | ICD-10-CM

## 2017-03-03 LAB — CBC & DIFF AND RETIC
BASO%: 0.3 % (ref 0.0–2.0)
Basophils Absolute: 0 10*3/uL (ref 0.0–0.1)
EOS%: 1.4 % (ref 0.0–7.0)
Eosinophils Absolute: 0.1 10*3/uL (ref 0.0–0.5)
HCT: 36.8 % (ref 34.8–46.6)
HGB: 12.6 g/dL (ref 11.6–15.9)
IMMATURE RETIC FRACT: 4.4 % (ref 1.60–10.00)
LYMPH%: 26.6 % (ref 14.0–49.7)
MCH: 29.6 pg (ref 25.1–34.0)
MCHC: 34.2 g/dL (ref 31.5–36.0)
MCV: 86.6 fL (ref 79.5–101.0)
MONO#: 0.2 10*3/uL (ref 0.1–0.9)
MONO%: 3.5 % (ref 0.0–14.0)
NEUT#: 4.5 10*3/uL (ref 1.5–6.5)
NEUT%: 68.2 % (ref 38.4–76.8)
Platelets: 154 10*3/uL (ref 145–400)
RBC: 4.25 10*6/uL (ref 3.70–5.45)
RDW: 13.2 % (ref 11.2–14.5)
Retic %: 1.75 % (ref 0.70–2.10)
Retic Ct Abs: 74.38 10*3/uL (ref 33.70–90.70)
WBC: 6.6 10*3/uL (ref 3.9–10.3)
lymph#: 1.8 10*3/uL (ref 0.9–3.3)

## 2017-03-03 LAB — COMPREHENSIVE METABOLIC PANEL
ALBUMIN: 3.7 g/dL (ref 3.5–5.0)
ALK PHOS: 43 U/L (ref 40–150)
ALT: 10 U/L (ref 0–55)
AST: 13 U/L (ref 5–34)
Anion Gap: 6 mEq/L (ref 3–11)
BILIRUBIN TOTAL: 0.41 mg/dL (ref 0.20–1.20)
BUN: 9.4 mg/dL (ref 7.0–26.0)
CO2: 27 mEq/L (ref 22–29)
CREATININE: 0.7 mg/dL (ref 0.6–1.1)
Calcium: 9.4 mg/dL (ref 8.4–10.4)
Chloride: 106 mEq/L (ref 98–109)
GLUCOSE: 118 mg/dL (ref 70–140)
Potassium: 3.3 mEq/L — ABNORMAL LOW (ref 3.5–5.1)
SODIUM: 138 meq/L (ref 136–145)
Total Protein: 7 g/dL (ref 6.4–8.3)

## 2017-03-03 LAB — IRON AND TIBC
%SAT: 39 % (ref 21–57)
Iron: 93 ug/dL (ref 41–142)
TIBC: 240 ug/dL (ref 236–444)
UIBC: 148 ug/dL (ref 120–384)

## 2017-03-03 LAB — PLATELET FUNCTION ASSAY: Collagen / Epinephrine: 115 seconds (ref 0–193)

## 2017-03-03 LAB — FERRITIN: Ferritin: 85 ng/ml (ref 9–269)

## 2017-03-04 LAB — PROTHROMBIN TIME (PT)
INR: 1 (ref 0.8–1.2)
Prothrombin Time: 10.2 s (ref 9.1–12.0)

## 2017-03-04 LAB — APTT: aPTT: 27 s (ref 24–33)

## 2017-03-04 LAB — FIBRINOGEN: FIBRINOGEN: 297 mg/dL (ref 193–507)

## 2017-03-05 LAB — VITAMIN C: Vitamin C: 1 mg/dL (ref 0.2–2.0)

## 2017-03-07 LAB — VON WILLEBRAND PANEL
FACTOR VIII ACTIVITY: 132 % (ref 57–163)
VON WILLEBRAND AG: 85 % (ref 50–200)
vWF Activity: 115 % (ref 50–200)

## 2017-04-06 ENCOUNTER — Other Ambulatory Visit: Payer: Self-pay | Admitting: Radiology

## 2017-04-08 ENCOUNTER — Encounter: Payer: Self-pay | Admitting: Hematology and Oncology

## 2017-04-08 ENCOUNTER — Telehealth: Payer: Self-pay | Admitting: Hematology and Oncology

## 2017-04-08 NOTE — Telephone Encounter (Signed)
CONFIRMED 10/10 BC APPOINTMENT WITH PATIENT FOR AFTERNOON, SOLIS PATIENT SO NO PAPERWORK SENT, LETTER WAS EMAILED WITH ADDRESS AND INFORMATION ABOUT UPCOMING APPOINTMENT

## 2017-04-11 ENCOUNTER — Other Ambulatory Visit: Payer: Self-pay

## 2017-04-11 DIAGNOSIS — R233 Spontaneous ecchymoses: Secondary | ICD-10-CM

## 2017-04-12 ENCOUNTER — Encounter: Payer: Self-pay | Admitting: *Deleted

## 2017-04-12 ENCOUNTER — Other Ambulatory Visit: Payer: Self-pay | Admitting: *Deleted

## 2017-04-12 DIAGNOSIS — C50411 Malignant neoplasm of upper-outer quadrant of right female breast: Secondary | ICD-10-CM

## 2017-04-12 DIAGNOSIS — Z170 Estrogen receptor positive status [ER+]: Principal | ICD-10-CM

## 2017-04-13 ENCOUNTER — Other Ambulatory Visit (HOSPITAL_BASED_OUTPATIENT_CLINIC_OR_DEPARTMENT_OTHER): Payer: 59

## 2017-04-13 ENCOUNTER — Encounter: Payer: Self-pay | Admitting: Radiation Oncology

## 2017-04-13 ENCOUNTER — Ambulatory Visit
Admission: RE | Admit: 2017-04-13 | Discharge: 2017-04-13 | Disposition: A | Discharge status: Home / Self Care | Payer: 59 | Source: Ambulatory Visit | Attending: Radiation Oncology | Admitting: Radiation Oncology

## 2017-04-13 ENCOUNTER — Ambulatory Visit (HOSPITAL_BASED_OUTPATIENT_CLINIC_OR_DEPARTMENT_OTHER): Payer: 59 | Admitting: Hematology and Oncology

## 2017-04-13 ENCOUNTER — Ambulatory Visit: Payer: 59 | Attending: Surgery | Admitting: Physical Therapy

## 2017-04-13 ENCOUNTER — Encounter: Payer: Self-pay | Admitting: Hematology and Oncology

## 2017-04-13 ENCOUNTER — Encounter: Payer: Self-pay | Admitting: Physical Therapy

## 2017-04-13 ENCOUNTER — Other Ambulatory Visit: Payer: Self-pay | Admitting: *Deleted

## 2017-04-13 DIAGNOSIS — Z170 Estrogen receptor positive status [ER+]: Principal | ICD-10-CM

## 2017-04-13 DIAGNOSIS — R238 Other skin changes: Secondary | ICD-10-CM

## 2017-04-13 DIAGNOSIS — R293 Abnormal posture: Secondary | ICD-10-CM | POA: Diagnosis present

## 2017-04-13 DIAGNOSIS — C50411 Malignant neoplasm of upper-outer quadrant of right female breast: Secondary | ICD-10-CM

## 2017-04-13 DIAGNOSIS — R233 Spontaneous ecchymoses: Secondary | ICD-10-CM

## 2017-04-13 LAB — COMPREHENSIVE METABOLIC PANEL
ALK PHOS: 44 U/L (ref 40–150)
ALT: 11 U/L (ref 0–55)
AST: 12 U/L (ref 5–34)
Albumin: 4.1 g/dL (ref 3.5–5.0)
Anion Gap: 9 mEq/L (ref 3–11)
BUN: 10.7 mg/dL (ref 7.0–26.0)
CHLORIDE: 106 meq/L (ref 98–109)
CO2: 25 meq/L (ref 22–29)
Calcium: 9.3 mg/dL (ref 8.4–10.4)
Creatinine: 0.8 mg/dL (ref 0.6–1.1)
EGFR: >60 mL/min/{1.73_m2} (ref >60)
GLUCOSE: 102 mg/dL (ref 70–140)
Potassium: 3.4 mEq/L — ABNORMAL LOW (ref 3.5–5.1)
SODIUM: 140 meq/L (ref 136–145)
TOTAL PROTEIN: 7.3 g/dL (ref 6.4–8.3)
Total Bilirubin: 0.49 mg/dL (ref 0.20–1.20)

## 2017-04-13 LAB — CBC WITH DIFFERENTIAL/PLATELET
BASO%: 0.5 % (ref 0.0–2.0)
Basophils Absolute: 0 10*3/uL (ref 0.0–0.1)
EOS%: 2.7 % (ref 0.0–7.0)
Eosinophils Absolute: 0.2 10*3/uL (ref 0.0–0.5)
HCT: 37.9 % (ref 34.8–46.6)
HGB: 12.8 g/dL (ref 11.6–15.9)
LYMPH%: 31.2 % (ref 14.0–49.7)
MCH: 29.3 pg (ref 25.1–34.0)
MCHC: 33.8 g/dL (ref 31.5–36.0)
MCV: 86.7 fL (ref 79.5–101.0)
MONO#: 0.3 10*3/uL (ref 0.1–0.9)
MONO%: 4.8 % (ref 0.0–14.0)
NEUT%: 60.8 % (ref 38.4–76.8)
NEUTROS ABS: 3.6 10*3/uL (ref 1.5–6.5)
PLATELETS: 166 10*3/uL (ref 145–400)
RBC: 4.37 10*6/uL (ref 3.70–5.45)
RDW: 13.4 % (ref 11.2–14.5)
WBC: 5.8 10*3/uL (ref 3.9–10.3)
lymph#: 1.8 10*3/uL (ref 0.9–3.3)

## 2017-04-13 MED ORDER — TAMOXIFEN CITRATE 20 MG PO TABS: 20.0000 mg | ORAL_TABLET | Freq: Every day | ORAL | 0 refills | Status: AC

## 2017-04-13 NOTE — Progress Notes (Signed)
Nutrition Assessment  Reason for Assessment:  Pt seen in Breast Clinic  ASSESSMENT:   51 year old female with new diagnosis of breast cancer.  Past medical history of asthma, migraine.    Patient with migraine during visit and not feeling well.  Medications:  reviewed  Labs: reviewed  Anthropometrics:   Height: 62 inches Weight: 160 lb BMI: 29   NUTRITION DIAGNOSIS: Food and nutrition related knowledge deficit related to new diagnosis of breast cancer as evidenced by no prior need for nutrition related information.  INTERVENTION:   Discussed and provided packet of information regarding nutritional tips for breast cancer patients.  Questions answered.  Teachback method used.  Contact information provided and patient knows to contact me with questions/concerns.    MONITORING, EVALUATION, and GOAL: Pt will consume a healthy plant based diet to maintain lean body mass throughout treatment.   Tashema Tiller B. Zenia Resides, [place], [place] Registered Dietitian [phone] (pager)

## 2017-04-13 NOTE — Patient Instructions (Signed)

## 2017-04-13 NOTE — Therapy (Addendum)
[place], [place], [address] Phone: [phone]   Fax:  [phone]  Physical Therapy Evaluation  Patient Details  Name: Michelle Adams MRN: [id number] Date of Birth: 12-11-1965 Referring Provider: Dr. Erroll Luna  Encounter Date: 04/13/2017      PT End of Session - 04/13/17 2040    Visit Number 1   Number of Visits 2   Date for PT Re-Evaluation 06/13/17   PT Start Time 1548   PT Stop Time 1620   PT Time Calculation (min) 32 min   Activity Tolerance Patient tolerated treatment well   Behavior During Therapy [facility] for tasks assessed/performed      Past Medical History:  Diagnosis Date  . Anemia    Low iron - to take ferrous gluconate  . Asthma   . Migraine     Past Surgical History:  Procedure Laterality Date  . CESAREAN SECTION      There were no vitals filed for this visit.       Subjective Assessment - 04/13/17 1644    Subjective Patient reports she is here today to be seen by her medical team for her newly diagnosed right breast cancer. She also has a migraine.   Patient is accompained by: Family member   Pertinent History Patient was diagnosed on 03/22/17 with right grade 2 invasive ductal carcinoma breast cancer. It meausres 1.1 cm and is located in the upper outer quadrant. It is ER positive, PR negative, and HER2 negative with a Ki67 of 40%.   Patient Stated Goals Reduce lymphedema risk and learn post op shoulder ROM HEP   Currently in Pain? Yes   Pain Score 10-Worst pain ever   Pain Location Head   Pain Orientation Proximal;Mid;Lower   Pain Descriptors / Indicators Sharp   Pain Type Acute pain   Pain Onset Today   Pain Frequency Intermittent   Aggravating Factors  Menstrual cycle  Has migraine headache   Pain Relieving Factors Unknown            [facility] PT Assessment - 04/13/17 0001      Assessment   Medical Diagnosis Right breast cancer   Referring Provider Dr.  [name] [place]   Onset Date/Surgical Date 03/22/17   Hand Dominance Right   Prior Therapy none     Precautions   Precautions Other (comment)   Precaution Comments active cancer     Restrictions   Weight Bearing Restrictions No     Balance Screen   Has the patient fallen in the past 6 months No   Has the patient had a decrease in activity level because of a fear of falling?  No   Is the patient reluctant to leave their home because of a fear of falling?  No     Home Environment   Living Environment Private residence   Living Arrangements Spouse/significant other;Children  Husband and 7 y.o. daughter   Available Help at Discharge Family     Prior Function   Level of Independence Independent   Vocation Full time employment   Medical illustrator; on computer most of the time   Leisure She does not exercise     Cognition   Overall Cognitive Status Within Functional Limits for tasks assessed     Posture/Postural Control   Posture/Postural Control Postural limitations   Postural Limitations Rounded Shoulders;Forward head     ROM / Strength   AROM / PROM / Strength AROM;Strength  AROM   AROM Assessment Site Shoulder;Cervical   Right/Left Shoulder Right;Left   Right Shoulder Extension 38 Degrees   Right Shoulder Flexion 131 Degrees   Right Shoulder ABduction 139 Degrees   Right Shoulder Internal Rotation 53 Degrees   Right Shoulder External Rotation 65 Degrees   Left Shoulder Extension 40 Degrees   Left Shoulder Flexion 139 Degrees   Left Shoulder ABduction 157 Degrees   Left Shoulder Internal Rotation 70 Degrees   Left Shoulder External Rotation 82 Degrees   Cervical Flexion WNL   Cervical Extension WNL   Cervical - Right Side Bend WNL   Cervical - Left Side Bend WNL   Cervical - Right Rotation WNL   Cervical - Left Rotation WNL     Strength   Overall Strength Within functional limits for tasks performed           LYMPHEDEMA/ONCOLOGY  QUESTIONNAIRE - 04/13/17 2037      Type   Cancer Type Right breast cancer     Lymphedema Assessments   Lymphedema Assessments Upper extremities     Right Upper Extremity Lymphedema   10 cm Proximal to Olecranon Process 31.3 cm   Olecranon Process 24.7 cm   10 cm Proximal to Ulnar Styloid Process 22.4 cm   Just Proximal to Ulnar Styloid Process 15.6 cm   Across Hand at [employer] 18.2 cm   At [place] of 2nd Digit 6.4 cm     Left Upper Extremity Lymphedema   10 cm Proximal to Olecranon Process 31.6 cm   Olecranon Process 25.7 cm   10 cm Proximal to Ulnar Styloid Process 22.7 cm   Just Proximal to Ulnar Styloid Process 16.3 cm   Across Hand at [employer] 17.7 cm   At [place] of 2nd Digit 6 cm         Objective measurements completed on examination: See above findings.     Patient was instructed today in a home exercise program today for post op shoulder range of motion. These included active assist shoulder flexion in sitting, scapular retraction, wall walking with shoulder abduction, and hands behind head external rotation.  She was encouraged to do these twice a day, holding 3 seconds and repeating 5 times when permitted by her physician.         PT Education - 04/13/17 2039    Education provided Yes   Education Details Lymphedema risk reduction and post op shoulder ROM HEP   Person(s) Educated Patient;Spouse   Methods Explanation;Demonstration;Handout   Comprehension Returned demonstration;Verbalized understanding              Breast Clinic Goals - 04/13/17 2045      Patient will be able to verbalize understanding of pertinent lymphedema risk reduction practices relevant to her diagnosis specifically related to skin care.   Time 1   Period Days   Status Achieved     Patient will be able to return demonstrate and/or verbalize understanding of the post-op home exercise program related to regaining shoulder range of motion.   Time 1   Period Days    Status Achieved     Patient will be able to verbalize understanding of the importance of attending the postoperative After Breast Cancer Class for further lymphedema risk reduction education and therapeutic exercise.   Time 1   Period Days   Status Achieved               Plan - 04/13/17 2041    Clinical  Impression Statement Patient was diagnosed on 03/22/17 with right grade 2 invasive ductal carcinoma breast cancer. It meausres 1.1 cm and is located in the upper outer quadrant. It is ER positive, PR negative, and HER2 negative with a Ki67 of 40%. Her multidisciplinary medical team met prior to her assessments to determine a recommended treatment plan. She is planning to have a right lumpectomy and sentinel node biopsy followed by Oncotype testing, radiation, and anti-estrogen therapy. She will benefit from a post op PT assessment to determine rehab needs.   History and Personal Factors relevant to plan of care: None   Clinical Presentation Stable   Clinical Decision Making Low   PT Frequency --  Eval and 1 f/u visit post op   PT Treatment/Interventions ADLs/Self Care Home Management;Therapeutic exercise;Patient/family education   PT Next Visit Plan Will reassess patient 3-4 weeks post op to determine PT needs   PT Home Exercise Plan Post op shoulder ROM HEP   Consulted and Agree with Plan of Care Patient;Family member/caregiver   Family Member Consulted Husband      Patient will benefit from skilled therapeutic intervention in order to improve the following deficits and impairments:  Decreased range of motion, Impaired UE functional use, Pain, Decreased knowledge of precautions, Postural dysfunction  Visit Diagnosis: Malignant neoplasm of upper-outer quadrant of right breast in female, estrogen receptor positive ([place]) - Plan: PT plan of care cert/re-cert  Abnormal posture - Plan: PT plan of care cert/re-cert   Patient will follow up at outpatient cancer rehab 3-4 weeks following  surgery.  If the patient requires physical therapy at that time, a specific plan will be dictated and sent to the referring physician for approval. The patient was educated today on appropriate basic range of motion exercises to begin post operatively and the importance of attending the After Breast Cancer class following surgery.  Patient was educated today on lymphedema risk reduction practices as it pertains to recommendations that will benefit the patient immediately following surgery.  She verbalized good understanding.      Problem List Patient Active Problem List   Diagnosis Date Noted  . Malignant neoplasm of upper-outer quadrant of right breast in female, estrogen receptor positive ([place]) 04/12/2017  . Spontaneous bruising 05/04/2016   Annia Friendly, PT 04/13/17 8:49 PM  [place], [place], [address] Phone: [phone]   Fax:  [phone]  Name: Michelle Adams MRN: [id number] Date of Birth: 07/16/1965  PHYSICAL THERAPY DISCHARGE SUMMARY  Visits from Start of Care: 1  Current functional level related to goals / functional outcomes: Unknown. Patient had surgery at another medical facility and will not f/u at [facility].   Remaining deficits: Unknown. Patient had surgery at another medical facility and will not f/u at [facility].   Education / Equipment: Post op HEP and lympehdema risk reduction education.  Plan: Patient agrees to discharge.  Patient goals were partially met. Patient is being discharged due to not returning since the last visit.  ?????         Annia Friendly, [place] 08/08/17 12:06 PM

## 2017-04-13 NOTE — Progress Notes (Signed)
Radiation Oncology         [phone] ________________________________  Name: Michelle Adams        MRN: [id number]  Date of Service: 04/13/2017 DOB: [birth year]  JO:ACZY, Arbie Cookey, MD  Erroll Luna, MD     REFERRING PHYSICIAN: Erroll Luna, MD   DIAGNOSIS: The encounter diagnosis was Malignant neoplasm of upper-outer quadrant of right breast in female, estrogen receptor positive ([place]).   HISTORY OF PRESENT ILLNESS: Michelle Adams is a 51 y.o. female seen in the multidisciplinary breast clinic for a new diagnosis of right breast cancer. The patient was noted to have to have a mass seen in the right breast on screening mammogram. She proceeded with diagnostic imaging and this revealed a 1.1 cm mass at 11:00 which was lobulated and nodular assymmetry in an axillary node was seen. 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 Her lymph node was also sampled and was negative for disease. She comes today to discuss options of treatment for her cancer.  PREVIOUS RADIATION THERAPY: No   PAST MEDICAL HISTORY:  Past Medical History:  Diagnosis Date  . Anemia    Low iron - to take ferrous gluconate       PAST SURGICAL HISTORY: Past Surgical History:  Procedure Laterality Date  . CESAREAN SECTION       FAMILY HISTORY:  Family History  Problem Relation Age of Onset  . Hypertension Mother   . Hyperlipidemia Mother   . Arthritis Mother   . Diabetes Father   . Hyperlipidemia Father   . Multiple myeloma Father      SOCIAL HISTORY:  reports that she has never smoked. She has never used smokeless tobacco. She reports that she drinks alcohol. She reports that she does not use drugs. The patient is married and lives in [place]. She works as an Materials engineer. She commutes to [place] for work.   ALLERGIES: Patient has no known allergies.   MEDICATIONS:  Current Outpatient Prescriptions  Medication Sig Dispense  Refill  . Butalbital-APAP-Caffeine 50-325-40 MG capsule Take 1-2 capsules by mouth every 6 (six) hours as needed for headache. 30 capsule 3  . ferrous gluconate (FERGON) 324 MG tablet TAKE AS DIRECTED TWICE A DAY  0  . ibuprofen (ADVIL,MOTRIN) 200 MG tablet Take 200 mg by mouth every 6 (six) hours as needed.    . Multiple Vitamin (MULTIVITAMIN) tablet Take 1 tablet by mouth daily.    . Probiotic Product (PROBIOTIC PO) Take 1 tablet by mouth daily.    Marland Kitchen UNABLE TO FIND Take 1 tablet by mouth 2 (two) times daily. PROTANDIM (herbal suppplement)     No current facility-administered medications for this encounter.      REVIEW OF SYSTEMS: On review of systems, the patient reports that she is doing well overall. She denies any chest pain, shortness of breath, cough, fevers, chills, night sweats, unintended weight changes. She denies any bowel or bladder disturbances, and denies abdominal pain, nausea or vomiting. She denies any new musculoskeletal or joint aches or pains. A complete review of systems is obtained and is otherwise negative.     PHYSICAL EXAM:  Wt Readings from Last 3 Encounters:  04/13/17 160 lb (72.6 kg)  02/03/17 163 lb 6.4 oz (74.1 kg)  10/22/16 165 lb 1.6 oz (74.9 kg)   Temp Readings from Last 3 Encounters:  04/13/17 98.5 F (36.9 C) (Oral)  02/03/17 99.5 F (37.5  C) (Oral)  05/04/16 98 F (36.7 C)   BP Readings from Last 3 Encounters:  04/13/17 119/70  02/03/17 123/89  10/22/16 126/68   Pulse Readings from Last 3 Encounters:  04/13/17 72  02/03/17 71  10/22/16 72     In general this is a well appearing Asian female in no acute distress. She is alert and oriented x4 and appropriate throughout the examination. HEENT reveals that the patient is normocephalic, atraumatic. EOMs are intact. PERRLA. Skin is intact without any evidence of gross lesions. Cardiopulmonary assessment is negative for acute distress and she exhibits normal effort.    ECOG = 0  0 -  Asymptomatic (Fully active, able to carry on all predisease activities without restriction)  1 - Symptomatic but completely ambulatory (Restricted in physically strenuous activity but ambulatory and able to carry out work of a light or sedentary nature. For example, light housework, office work)  2 - Symptomatic, <50% in bed during the day (Ambulatory and capable of all self care but unable to carry out any work activities. Up and about more than 50% of waking hours)  3 - Symptomatic, >50% in bed, but not bedbound (Capable of only limited self-care, confined to bed or chair 50% or more of waking hours)  4 - Bedbound (Completely disabled. Cannot carry on any self-care. Totally confined to bed or chair)  5 - Death   Eustace Pen MM, Creech RH, Tormey DC, et al. [phone]). "Toxicity and response criteria of the [employer] Group". [place] Oncol. 5 (6): 649-55    LABORATORY DATA:  Lab Results  Component Value Date   WBC 5.8 04/13/2017   HGB 12.8 04/13/2017   HCT 37.9 04/13/2017   MCV 86.7 04/13/2017   PLT 166 04/13/2017   Lab Results  Component Value Date   NA 140 04/13/2017   K 3.4 (L) 04/13/2017   CL 102 12/07/2014   CO2 25 04/13/2017   Lab Results  Component Value Date   ALT 11 04/13/2017   AST 12 04/13/2017   ALKPHOS 44 04/13/2017   BILITOT 0.49 04/13/2017      RADIOGRAPHY: No results found.     IMPRESSION/PLAN: 1. Stage IA, cT1cN0M0 grade 2, ER positive, invasive ductal carcinoma of the right breast. Dr. Lisbeth Renshaw discusses the pathology findings and reviews the nature of invasive ductal breast disease. The consensus from the breast conference included breast conservation with lumpectomy with sentinel mapping. Her tumor will be tested for oncotype dx score to determine a role for systemic therapy. Provided that chemotherapy is not indicated, the patient's course would then be followed by external radiotherapy to the breast followed by antiestrogen therapy. We  discussed the risks, benefits, short, and long term effects of radiotherapy, and the patient is interested in proceeding. Dr. Lisbeth Renshaw discusses the delivery and logistics of radiotherapy and anticipates a course of 6 1/2 weeks. We will see her back about 2 weeks after surgery to move forward with the simulation and planning process and anticipate starting radiotherapy about 4 weeks after surgery.   In a visit lasting 60 minutes, greater than 50% of the time was spent face to face discussing her case, and coordinating the patient's care.   The above documentation reflects my direct findings during this shared patient visit. Please see the separate note by Dr. Lisbeth Renshaw on this date for the remainder of the patient's plan of care.    Carola Rhine, PAC

## 2017-04-14 ENCOUNTER — Telehealth: Payer: Self-pay | Admitting: Hematology and Oncology

## 2017-04-14 ENCOUNTER — Other Ambulatory Visit: Payer: Self-pay | Admitting: *Deleted

## 2017-04-14 ENCOUNTER — Telehealth: Payer: Self-pay | Admitting: *Deleted

## 2017-04-14 DIAGNOSIS — C50411 Malignant neoplasm of upper-outer quadrant of right female breast: Secondary | ICD-10-CM

## 2017-04-14 DIAGNOSIS — Z170 Estrogen receptor positive status [ER+]: Principal | ICD-10-CM

## 2017-04-14 NOTE — Progress Notes (Signed)
[facility] NOTE  Patient Care Team: Maurice Small, MD as PCP - General (Family Medicine) Erroll Luna, MD as Consulting Physician (General Surgery) Nicholas Lose, MD as Consulting Physician (Hematology and Oncology) Kyung Rudd, MD as Consulting Physician (Radiation Oncology)  CHIEF COMPLAINTS/PURPOSE OF CONSULTATION:  Newly diagnosed breast cancer  HISTORY OF PRESENTING ILLNESS:  Michelle Adams 51 y.o. female is here because of recent diagnosis of right breast cancer. Patient had a screening mammogram that detected a right breast mass. Ultrasound it measured 11 mm 11:00 position. Biopsy of this mass revealed invasive ductal carcinoma with DCIS that was ER positive and PR negative with a Ki-67 of 40% and HER-2 negative. She was presented this morning in the multidisciplinary tumor board and she is here today to discuss a treatment plan.  I reviewed her records extensively and collaborated the history with the patient.  SUMMARY OF ONCOLOGIC HISTORY:   Malignant neoplasm of upper-outer quadrant of right breast in female, estrogen receptor positive ([place])   04/06/2017 Initial Diagnosis    Screening detected right breast mass ultrasound 11 mm at 11:00 Biopsy: IDC with DCIS, right lymph node biopsy benign, grade 2, ER 60%, PR 0%, Ki-67 40%, HER-2 negative ratio 1, T1c N0 stage IA clinical stage      MEDICAL HISTORY:  Past Medical History:  Diagnosis Date  . Anemia    Low iron - to take ferrous gluconate  . Asthma   . Migraine     SURGICAL HISTORY: Past Surgical History:  Procedure Laterality Date  . CESAREAN SECTION      SOCIAL HISTORY: Social History   Social History  . Marital status: Married    Spouse name: N/A  . Number of children: N/A  . Years of education: N/A   Occupational History  . Not on file.   Social History Main Topics  . Smoking status: Never Smoker  . Smokeless tobacco: Never Used  . Alcohol use Yes     Comment: social  . Drug  use: No  . Sexual activity: Yes    Birth control/ protection: Condom   Other Topics Concern  . Not on file   Social History Narrative  . No narrative on file    FAMILY HISTORY: Family History  Problem Relation Age of Onset  . Hypertension Mother   . Hyperlipidemia Mother   . Arthritis Mother   . Diabetes Father   . Hyperlipidemia Father   . Multiple myeloma Father     ALLERGIES:  has No Known Allergies.  MEDICATIONS:  Current Outpatient Prescriptions  Medication Sig Dispense Refill  . Butalbital-APAP-Caffeine 50-325-40 MG capsule Take 1-2 capsules by mouth every 6 (six) hours as needed for headache. 30 capsule 3  . ferrous gluconate (FERGON) 324 MG tablet TAKE AS DIRECTED TWICE A DAY  0  . ibuprofen (ADVIL,MOTRIN) 200 MG tablet Take 200 mg by mouth every 6 (six) hours as needed.    . Multiple Vitamin (MULTIVITAMIN) tablet Take 1 tablet by mouth daily.    . Probiotic Product (PROBIOTIC PO) Take 1 tablet by mouth daily.    Marland Kitchen UNABLE TO FIND Take 1 tablet by mouth 2 (two) times daily. PROTANDIM (herbal suppplement)    . tamoxifen (NOLVADEX) 20 MG tablet Take 1 tablet (20 mg total) by mouth daily. 30 tablet 0   No current facility-administered medications for this visit.     REVIEW OF SYSTEMS:   Constitutional: Denies fevers, chills or abnormal night sweats Eyes: Denies blurriness of vision,  double vision or watery eyes Ears, nose, mouth, throat, and face: Denies mucositis or sore throat Respiratory: Denies cough, dyspnea or wheezes Cardiovascular: Denies palpitation, chest discomfort or lower extremity swelling Gastrointestinal:  Denies nausea, heartburn or change in bowel habits Skin: Denies abnormal skin rashes Lymphatics: Denies new lymphadenopathy or easy bruising Neurological:Denies numbness, tingling or new weaknesses Behavioral/Psych: Mood is stable, no new changes   All other systems were reviewed with the patient and are negative.  PHYSICAL EXAMINATION: ECOG  PERFORMANCE STATUS: 1 - Symptomatic but completely ambulatory  Vitals:   04/13/17 1305  BP: 119/70  Pulse: 72  Resp: 18  Temp: 98.5 F (36.9 C)  SpO2: 100%   Filed Weights   04/13/17 1305  Weight: 160 lb (72.6 kg)    GENERAL:alert, no distress and comfortable SKIN: skin color, texture, turgor are normal, no rashes or significant lesions EYES: normal, conjunctiva are pink and non-injected, sclera clear OROPHARYNX:no exudate, no erythema and lips, buccal mucosa, and tongue normal  NECK: supple, thyroid normal size, non-tender, without nodularity LYMPH:  no palpable lymphadenopathy in the cervical, axillary or inguinal LUNGS: clear to auscultation and percussion with normal breathing effort HEART: regular rate & rhythm and no murmurs and no lower extremity edema ABDOMEN:abdomen soft, non-tender and normal bowel sounds Musculoskeletal:no cyanosis of digits and no clubbing  PSYCH: alert & oriented x 3 with fluent speech NEURO: no focal motor/sensory deficits BREAST: No palpable nodules in breast. No palpable axillary or supraclavicular lymphadenopathy (exam performed in the presence of a chaperone)   LABORATORY DATA:  I have reviewed the data as listed Lab Results  Component Value Date   WBC 5.8 04/13/2017   HGB 12.8 04/13/2017   HCT 37.9 04/13/2017   MCV 86.7 04/13/2017   PLT 166 04/13/2017   Lab Results  Component Value Date   NA 140 04/13/2017   K 3.4 (L) 04/13/2017   CL 102 12/07/2014   CO2 25 04/13/2017    RADIOGRAPHIC STUDIES: I have personally reviewed the radiological reports and agreed with the findings in the report.  ASSESSMENT AND PLAN:  Malignant neoplasm of upper-outer quadrant of right breast in female, estrogen receptor positive ([place]) 04/06/2017: Screening detected right breast mass ultrasound 11 mm at 11:00 Biopsy: IDC with DCIS, right lymph node biopsy benign, grade 2, ER 60%, PR 0%, Ki-67 40%, HER-2 negative ratio 1, T1c N0 stage IA clinical  stage  Pathology and radiology counseling:Discussed with the patient, the details of pathology including the type of breast cancer,the clinical staging, the significance of ER, PR and HER-2/neu receptors and the implications for treatment. After reviewing the pathology in detail, we proceeded to discuss the different treatment options between surgery, radiation, chemotherapy, antiestrogen therapies.  Recommendations: 1. Breast conserving surgery followed by 2. Oncotype DX testing to determine if chemotherapy would be of any benefit followed by 3. Adjuvant radiation therapy followed by 4. Adjuvant antiestrogen therapy  Since the patient has chest and abdominal pains, we will obtain a CT chest abdomen pelvis with contrast along with bone scan.  Oncotype counseling: I discussed Oncotype DX test. I explained to the patient that this is a 21 gene panel to evaluate patient tumors DNA to calculate recurrence score. This would help determine whether patient has high risk or intermediate risk or low risk breast cancer. She understands that if her tumor was found to be high risk, she would benefit from systemic chemotherapy. If low risk, no need of chemotherapy. If she was found to be intermediate risk,  we would need to evaluate the score as well as other risk factors and determine if an abbreviated chemotherapy may be of benefit.  Return to clinic after surgery to discuss final pathology report and then determine if Oncotype DX testing will need to be sent.     All questions were answered. The patient knows to call the clinic with any problems, questions or concerns.    Rulon Eisenmenger, MD 04/14/17

## 2017-04-14 NOTE — Telephone Encounter (Signed)
Received vm call from pt with question about a scan to be ordered.  Returned call & left message to call back.

## 2017-04-14 NOTE — Assessment & Plan Note (Signed)
04/06/2017: Screening detected right breast mass ultrasound 11 mm at 11:00 Biopsy: IDC with DCIS, right lymph node biopsy benign, grade 2, ER 60%, PR 0%, Ki-67 40%, HER-2 negative ratio 1, T1c N0 stage IA clinical stage  Pathology and radiology counseling:Discussed with the patient, the details of pathology including the type of breast cancer,the clinical staging, the significance of ER, PR and HER-2/neu receptors and the implications for treatment. After reviewing the pathology in detail, we proceeded to discuss the different treatment options between surgery, radiation, chemotherapy, antiestrogen therapies.  Recommendations: 1. Breast conserving surgery followed by 2. Oncotype DX testing to determine if chemotherapy would be of any benefit followed by 3. Adjuvant radiation therapy followed by 4. Adjuvant antiestrogen therapy  Since the patient has chest and abdominal pains, we will obtain a CT chest abdomen pelvis with contrast along with bone scan.  Oncotype counseling: I discussed Oncotype DX test. I explained to the patient that this is a 21 gene panel to evaluate patient tumors DNA to calculate recurrence score. This would help determine whether patient has high risk or intermediate risk or low risk breast cancer. She understands that if her tumor was found to be high risk, she would benefit from systemic chemotherapy. If low risk, no need of chemotherapy. If she was found to be intermediate risk, we would need to evaluate the score as well as other risk factors and determine if an abbreviated chemotherapy may be of benefit.  Return to clinic after surgery to discuss final pathology report and then determine if Oncotype DX testing will need to be sent.   

## 2017-04-14 NOTE — Telephone Encounter (Signed)
Per 10/10 no los at check out °

## 2017-04-15 ENCOUNTER — Encounter: Payer: Self-pay | Admitting: General Practice

## 2017-04-15 NOTE — Progress Notes (Signed)
[facility] Psychosocial Distress Screening Spiritual Care  Met with Michelle Adams and her husband in [facility] to introduce [facility] team/resources, reviewing distress screen per protocol.  The patient scored a 8 on the Psychosocial Distress Thermometer which indicates severe distress. Also assessed for distress and other psychosocial needs.   ONCBCN DISTRESS SCREENING 04/15/2017  Screening Type Initial Screening  Distress experienced in past week (1-10) 8  Family Problem type Children;Partner  Spiritual/Religous concerns type Facing my mortality  Information Concerns Type Lack of info about diagnosis;Lack of info about treatment;Lack of info about complementary therapy choices  Physical Problem type Pain;Nausea/vomiting  Referral to support programs Yes   [name] had a terrible migraine by the end of [facility], so we kept our encounter brief.  Provided couple our full packet of [facility] team/programming information.  Follow up needed: Yes.  We plan to f/u by phone next week to explore needs and resources in more detail.   [place], [place], [facility] Pager [phone] Voicemail [phone]

## 2017-04-18 ENCOUNTER — Telehealth: Payer: Self-pay | Admitting: *Deleted

## 2017-04-18 ENCOUNTER — Encounter: Payer: Self-pay | Admitting: General Practice

## 2017-04-18 NOTE — Progress Notes (Signed)
[facility] Spiritual Care Note  Followed up with Michelle Adams briefly by phone.  Per pt, she is feeling much better today and describes herself as "excited" to have her breast MRI tomorrow as another step toward moving her dx/tx process forward.  She knows to contact Support Team with any needs, questions, or concerns that arise along the way.   [place], [place], [facility] Pager [phone] Voicemail [phone]

## 2017-04-18 NOTE — Telephone Encounter (Signed)
Spoke with patient and email.  She is requesting genetic counseling and testing even though she does not qualify.  Informed her of the scan appointments and genetic appt.

## 2017-04-19 ENCOUNTER — Ambulatory Visit (HOSPITAL_COMMUNITY)
Admission: RE | Admit: 2017-04-19 | Discharge: 2017-04-19 | Disposition: A | Discharge status: Home / Self Care | Payer: 59 | Source: Ambulatory Visit | Attending: Surgery | Admitting: Surgery

## 2017-04-19 ENCOUNTER — Encounter: Payer: Self-pay | Admitting: Genetics

## 2017-04-19 ENCOUNTER — Ambulatory Visit (HOSPITAL_BASED_OUTPATIENT_CLINIC_OR_DEPARTMENT_OTHER): Payer: 59 | Admitting: Genetics

## 2017-04-19 ENCOUNTER — Other Ambulatory Visit: Payer: 59

## 2017-04-19 DIAGNOSIS — Z170 Estrogen receptor positive status [ER+]: Secondary | ICD-10-CM | POA: Insufficient documentation

## 2017-04-19 DIAGNOSIS — C50411 Malignant neoplasm of upper-outer quadrant of right female breast: Secondary | ICD-10-CM

## 2017-04-19 DIAGNOSIS — Z803 Family history of malignant neoplasm of breast: Secondary | ICD-10-CM | POA: Diagnosis not present

## 2017-04-19 DIAGNOSIS — Z806 Family history of leukemia: Secondary | ICD-10-CM

## 2017-04-19 MED ORDER — GADOBENATE DIMEGLUMINE 529 MG/ML IV SOLN
15.0000 mL | Freq: Once | INTRAVENOUS | Status: AC | PRN
  Administered 2017-04-19: 14 mL via INTRAVENOUS

## 2017-04-19 NOTE — Progress Notes (Signed)
REFERRING PROVIDER: Nicholas Lose, MD [address] [place], [place] [address]  PRIMARY PROVIDER:  Maurice Small, MD  PRIMARY REASON FOR VISIT:  1. Malignant neoplasm of upper-outer quadrant of right breast in female, estrogen receptor positive ([place])   2. Family history of breast cancer     HISTORY OF PRESENT ILLNESS:   Ms. Hurrell, a 51 y.o. female, was seen for a [facility] cancer genetics consultation at the request of Dr. Lindi Adie due to a personal and family history of cancer.  Ms. Avena presents to clinic today to discuss the possibility of a hereditary predisposition to cancer, genetic testing, and to further clarify her future cancer risks, as well as potential cancer risks for family members.   In Oct 2018, at the age of 33, Ms. 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  She is currently considering treatment options at this time.  She reports her current surgery date is scheduled for 04/27/2017 for breast conserving surgery.     CANCER HISTORY:    Malignant neoplasm of upper-outer quadrant of right breast in female, estrogen receptor positive ([place])   04/06/2017 Initial Diagnosis    Screening detected right breast mass ultrasound 11 mm at 11:00 Biopsy: IDC with DCIS, right lymph node biopsy benign, grade 2, ER 60%, PR 0%, Ki-67 40%, HER-2 negative ratio 1, T1c N0 stage IA clinical stage        HORMONAL RISK FACTORS:    Ovaries intact: yes.  Hysterectomy: no.  Colonoscopy: yes;2015 reportedly normal. Mammogram within the last year: yes.  Past Medical History:  Diagnosis Date  . Anemia    Low iron - to take ferrous gluconate  . Asthma   . Breast cancer ([place])   . Family history of breast cancer   . Migraine     Past Surgical History:  Procedure Laterality Date  . CESAREAN SECTION      Social History   Social History  . Marital status: Married    Spouse name: N/A  . Number of children: N/A  . Years of  education: N/A   Social History Main Topics  . Smoking status: Never Smoker  . Smokeless tobacco: Never Used  . Alcohol use Yes     Comment: social  . Drug use: No  . Sexual activity: Yes    Birth control/ protection: Condom   Other Topics Concern  . None   Social History Narrative  . None     FAMILY HISTORY:  We obtained a detailed, 4-generation family history.  Significant diagnoses are listed below: Family History  Problem Relation Age of Onset  . Hypertension Mother   . Hyperlipidemia Mother   . Arthritis Mother   . Diabetes Father   . Hyperlipidemia Father   . Multiple myeloma Father 68  . Heart disease Maternal Grandmother   . Leukemia Maternal Uncle 11  . Breast cancer Cousin 60       died at 67  . Breast cancer Cousin 18  . Breast cancer Cousin   . Breast cancer Other 31   Ms Amundson has 2 daughters ages 76 and 26 who have no history of cancer.  Ms. Rudy has 2 brothers in their 18's and 66's, and 1 sister who is in her 48's with no history of cancer.  Her sister still has her ovaries an uterus intact.  All of her siblings have children, none with any history of cancer.   Ms. Dolliver father  was diagnosed with multiple myeloma at 36 and is no 77.  Ms. Ueda has 40 paternal aunts and uncles. -1 paternal uncle has a daughter who was diagnosed with breast cancer and died in her 10's.   -1 paternal uncle had a granddaugher (patient's cousin once removed) with breast cancer diagnosed in her 50's and a grandson (patient's cousin once removed) who died from a blood cancer.   -79 other aunts and uncles have no history of cancer and are all older than 49 years of age.   Ms. Mcgann has several other paternal cousins, none with any known history of cancer.  She does report that an extended cousin on her father's side also had breast cancer diagnosed at 89.  Ms. Navedo paternal grandfather and grandmother died in their 35's/80's with no history of cancer.    Ms. Bram  mother is 67 with no history of cancer.  She has her uterus and ovaries intact. Ms. Trotter has 4 maternal aunts and 2 maternal uncles: -1 maternal uncle is in his 38's/70's with no history of cancer. -1 maternal uncle died at 41 years of age due to leukemia.   -4 maternal aunts are in their 38's and 59's with no history of cancer.  Ms. Urbanski has several maternal cousins, none with any known history of cancer.  She does report that a maternal second cousin also had breast cancer.  Ms. Dodds maternal grandfather died in his 45's with no history .  Her maternal grandmother died at 61 due to heart disease, and had no history of cancer.   Ms. Boulay is unaware of previous family history of genetic testing for hereditary cancer risks. Patient's maternal ancestors are of Asian/Pacific Islander descent, and paternal ancestors are of Asian/Pacific Islander descent. There is no reported Ashkenazi Jewish ancestry. There is no known consanguinity.  GENETIC COUNSELING ASSESSMENT: Suezette Lafave is a 51 y.o. female with a personal and family history which is somewhat suggestive of a Hereditary Cancer Predisposition Syndrome. We, therefore, discussed and recommended the following at today's visit.   DISCUSSION:  We reviewed the characteristics, features and inheritance patterns of hereditary cancer syndromes. We also discussed genetic testing, including the appropriate family members to test, the process of testing, insurance coverage and turn-around-time for results. We discussed the implications of a negative, positive and/or variant of uncertain significant result. In order to get genetic test results in a timely manner so that Ms. Engebretson can use these genetic test results for surgical decisions, we recommended Ms. Lana pursue genetic testing for the Breast Cancer STAT Panel.  The STAT Breast cancer panel offered by [facility] includes sequencing and rearrangement analysis for the following 9 genes:  ATM, BRCA1,  BRCA2, CDH1, CHEK2, PALB2, PTEN, STK11 and TP53.   If this test is negative, we then recommend Ms. Funchess pursue reflex genetic testing to the Common Hereditary Cancers gene panel.  APC, ATM, AXIN2, BARD1, BMPR1A, BRCA1, BRCA2, BRIP1, CDH1, CDK4, CDKN2A (p14ARF), CDKN2A (p16INK4a), CHEK2, CTNNA1, DICER1, EPCAM, GREM1, KIT, MEN1, MLH1, MSH2, MSH3, MSH6, MUTYH, NBN, NF1, NHTL1, PALB2, PDGFRA, PMS2, POLD1, POLE, PTEN, RAD50, RAD51C, RAD51D, SDHB, SDHC, SDHD, SMAD4, SMARCA4. STK11, TP53, TSC1, TSC2, VHL, SDHA, HOXB13   We discussed that only 5-10% of cancers are associated with a Hereditary cancer predisposition syndrome.  One of the most common hereditary cancer syndromes that increases breast cancer risk is called Hereditary Breast and Ovarian Cancer (HBOC) syndrome.  This syndrome is caused by mutations in the BRCA1 and BRCA2 genes.  This syndrome increases an individual's lifetime risk to develop breast, ovarian, pancreatic, and other types of cancer.  There are also many other cancer predisposition syndromes caused by mutations in several other genes.  We discussed that if she is found to have a mutation in one of these genes, it may impact surgical decisions, and alter future medical management recommendations such as increased cancer screenings and consideration of risk reducing surgeries.  A positive result could also have implications for the patient's family members.  A Negative result would mean we were unable to identify a hereditary component to her cancer, but does not rule out the possibility of a hereditary basis for her cancer.  There could be mutations that are undetectable by current technology, or in genes not yet tested or identified to increase cancer risk.  If genetic testing is negative, we would recommend her daughters start annual mammograms starting at age 2 (2 years earlier than their mother's diagnosis).    We discussed the potential to find a Variant of Uncertain Significance or  VUS.  These are variants that have not yet been identified as pathogenic or benign, and it is unknown if this variant is associated with increased cancer risk or if this is a normal finding.  Most VUS's are reclassified to benign or likely benign.   It should not be used to make medical management decisions. With time, we suspect the lab will determine the significance of any VUS's identified if any.   Based on Ms. Novack's personal and family history of cancer, she meets medical criteria for genetic testing. Despite that she meets criteria, she may still have an out of pocket cost. We discussed that if her out of pocket cost for testing is over $100, the laboratory will call and confirm whether she wants to proceed with testing.  If the out of pocket cost of testing is less than $100 she will be billed by the genetic testing laboratory.   PLAN: After considering the risks, benefits, and limitations, Ms. Carducci  provided informed consent to pursue genetic testing and the blood sample was sent to [facility] for analysis of the Breast Cancer STAT Panel. Results should be available within approximately 5-12 days' time, at which point they will be disclosed by telephone to Ms. Ohanian, as will any additional recommendations warranted by these results. Ms. Vilchis will receive a summary of her genetic counseling visit and a copy of her results once available. This information will also be available in Epic. We encouraged Ms. Pereida to remain in contact with cancer genetics annually so that we can continuously update the family history and inform her of any changes in cancer genetics and testing that may be of benefit for her family. Ms. Garoutte questions were answered to her satisfaction today. Our contact information was provided should additional questions or concerns arise.  Lastly, we encouraged Ms. Buckingham to remain in contact with cancer genetics annually so that we can continuously update the family  history and inform her of any changes in cancer genetics and testing that may be of benefit for this family.   Ms.  Attwood questions were answered to her satisfaction today. Our contact information was provided should additional questions or concerns arise. Thank you for the referral and allowing [place] to share in the care of your patient.   Tana Felts, MS Genetic Counselor [email or web address] phone: [phone]  The patient was seen for a total of 30 minutes in face-to-face genetic counseling.  This patient  was discussed with Drs. Magrinat, Lindi Adie and/or Burr Medico who agrees with the above.

## 2017-04-20 ENCOUNTER — Encounter: Payer: Self-pay | Admitting: Hematology and Oncology

## 2017-04-20 ENCOUNTER — Other Ambulatory Visit: Payer: Self-pay | Admitting: Surgery

## 2017-04-20 DIAGNOSIS — N6320 Unspecified lump in the left breast, unspecified quadrant: Secondary | ICD-10-CM

## 2017-04-20 DIAGNOSIS — N6310 Unspecified lump in the right breast, unspecified quadrant: Secondary | ICD-10-CM

## 2017-04-21 ENCOUNTER — Other Ambulatory Visit: Payer: Self-pay | Admitting: Radiology

## 2017-04-22 ENCOUNTER — Encounter (HOSPITAL_COMMUNITY): Payer: Self-pay

## 2017-04-22 ENCOUNTER — Ambulatory Visit (HOSPITAL_COMMUNITY)
Admission: RE | Admit: 2017-04-22 | Discharge: 2017-04-22 | Disposition: A | Discharge status: Home / Self Care | Payer: 59 | Source: Ambulatory Visit | Attending: Hematology and Oncology | Admitting: Hematology and Oncology

## 2017-04-22 DIAGNOSIS — Z170 Estrogen receptor positive status [ER+]: Secondary | ICD-10-CM | POA: Diagnosis not present

## 2017-04-22 DIAGNOSIS — C50411 Malignant neoplasm of upper-outer quadrant of right female breast: Secondary | ICD-10-CM

## 2017-04-22 DIAGNOSIS — K7689 Other specified diseases of liver: Secondary | ICD-10-CM | POA: Diagnosis not present

## 2017-04-22 DIAGNOSIS — R109 Unspecified abdominal pain: Secondary | ICD-10-CM | POA: Diagnosis not present

## 2017-04-22 MED ORDER — IOPAMIDOL (ISOVUE-300) INJECTION 61%
INTRAVENOUS | Status: AC
  Administered 2017-04-22: 100 mL
  Filled 2017-04-22: qty 100

## 2017-04-22 MED ORDER — TECHNETIUM TC 99M MEDRONATE IV KIT
25.0000 | PACK | Freq: Once | INTRAVENOUS | Status: AC | PRN
  Administered 2017-04-22: 25 via INTRAVENOUS

## 2017-04-25 ENCOUNTER — Other Ambulatory Visit: Payer: Self-pay | Admitting: *Deleted

## 2017-04-25 ENCOUNTER — Ambulatory Visit: Payer: Self-pay | Admitting: Surgery

## 2017-04-25 DIAGNOSIS — C50411 Malignant neoplasm of upper-outer quadrant of right female breast: Secondary | ICD-10-CM

## 2017-04-25 DIAGNOSIS — Z170 Estrogen receptor positive status [ER+]: Principal | ICD-10-CM

## 2017-04-25 DIAGNOSIS — C50911 Malignant neoplasm of unspecified site of right female breast: Secondary | ICD-10-CM

## 2017-04-25 NOTE — Progress Notes (Signed)
Attempted to call patient with scan results.  Patient prefers email. Sent email with results and need for MRI abdomen.

## 2017-04-26 ENCOUNTER — Other Ambulatory Visit: Payer: 59

## 2017-04-26 ENCOUNTER — Inpatient Hospital Stay
Admission: RE | Admit: 2017-04-26 | Discharge: 2017-04-26 | Disposition: A | Discharge status: Home / Self Care | Payer: 59 | Source: Ambulatory Visit | Attending: Surgery | Admitting: Surgery

## 2017-04-26 ENCOUNTER — Other Ambulatory Visit: Payer: Self-pay | Admitting: *Deleted

## 2017-04-26 DIAGNOSIS — Z170 Estrogen receptor positive status [ER+]: Principal | ICD-10-CM

## 2017-04-26 DIAGNOSIS — C50411 Malignant neoplasm of upper-outer quadrant of right female breast: Secondary | ICD-10-CM

## 2017-04-27 ENCOUNTER — Ambulatory Visit (HOSPITAL_BASED_OUTPATIENT_CLINIC_OR_DEPARTMENT_OTHER): Admission: RE | Admit: 2017-04-27 | Payer: 59 | Source: Ambulatory Visit | Admitting: Obstetrics and Gynecology

## 2017-04-27 ENCOUNTER — Encounter (HOSPITAL_BASED_OUTPATIENT_CLINIC_OR_DEPARTMENT_OTHER): Payer: Self-pay | Admitting: *Deleted

## 2017-04-27 ENCOUNTER — Encounter (HOSPITAL_BASED_OUTPATIENT_CLINIC_OR_DEPARTMENT_OTHER): Admission: RE | Payer: Self-pay | Source: Ambulatory Visit

## 2017-04-27 SURGERY — LIGATION, FALLOPIAN TUBE, LAPAROSCOPIC
Anesthesia: Choice | Laterality: Bilateral

## 2017-04-29 ENCOUNTER — Other Ambulatory Visit: Payer: 59

## 2017-04-29 ENCOUNTER — Encounter: Payer: Self-pay | Admitting: Genetics

## 2017-04-29 ENCOUNTER — Telehealth: Payer: Self-pay | Admitting: Genetics

## 2017-04-29 NOTE — Telephone Encounter (Signed)
Revealed negative genetic testing.  This normal result is reassuring and indicates that it is unlikely Michelle Adams's cancer is due to a hereditary cause.  It is unlikely that there is an increased risk of another cancer due to a mutation in one of these genes.  However, genetic testing is not perfect, and cannot definitively rule out a hereditary cause.  It will be important for her to keep in contact with genetics to learn if any additional testing may be needed in the future.     She is still deciding on weather she would like to have testing for the full panel (47 genes) she will contact me (within 90 days to have it at no extra cost) if she would like me to order this testing for her. Her results were mailed to her.  I recommended her daughters start routine breast screening at age 43.

## 2017-04-30 ENCOUNTER — Ambulatory Visit (HOSPITAL_COMMUNITY): Payer: 59

## 2017-04-30 ENCOUNTER — Ambulatory Visit (HOSPITAL_COMMUNITY)
Admission: RE | Admit: 2017-04-30 | Discharge: 2017-04-30 | Disposition: A | Discharge status: Home / Self Care | Payer: 59 | Source: Ambulatory Visit | Attending: Oncology | Admitting: Oncology

## 2017-04-30 DIAGNOSIS — K7689 Other specified diseases of liver: Secondary | ICD-10-CM | POA: Insufficient documentation

## 2017-04-30 DIAGNOSIS — Z170 Estrogen receptor positive status [ER+]: Secondary | ICD-10-CM | POA: Diagnosis not present

## 2017-04-30 DIAGNOSIS — C50411 Malignant neoplasm of upper-outer quadrant of right female breast: Secondary | ICD-10-CM | POA: Insufficient documentation

## 2017-04-30 DIAGNOSIS — D1803 Hemangioma of intra-abdominal structures: Secondary | ICD-10-CM | POA: Diagnosis not present

## 2017-04-30 MED ORDER — GADOBENATE DIMEGLUMINE 529 MG/ML IV SOLN
15.0000 mL | Freq: Once | INTRAVENOUS | Status: AC | PRN
  Administered 2017-04-30: 15 mL via INTRAVENOUS

## 2017-05-02 ENCOUNTER — Ambulatory Visit: Payer: Self-pay | Admitting: Genetics

## 2017-05-02 ENCOUNTER — Ambulatory Visit: Payer: Self-pay | Admitting: Surgery

## 2017-05-02 DIAGNOSIS — C50911 Malignant neoplasm of unspecified site of right female breast: Secondary | ICD-10-CM

## 2017-05-02 DIAGNOSIS — Z170 Estrogen receptor positive status [ER+]: Principal | ICD-10-CM

## 2017-05-02 DIAGNOSIS — C50411 Malignant neoplasm of upper-outer quadrant of right female breast: Secondary | ICD-10-CM

## 2017-05-02 DIAGNOSIS — Z803 Family history of malignant neoplasm of breast: Secondary | ICD-10-CM

## 2017-05-02 DIAGNOSIS — Z1379 Encounter for other screening for genetic and chromosomal anomalies: Secondary | ICD-10-CM

## 2017-05-02 NOTE — H&P (Signed)
Michelle Adams 04/13/2017 7:35 AM Location: [facility] Patient #: [id number] DOB: 11-Dec-1965 Undefined / Language: Michelle Adams / Race: Undefined Female   History of Present Illness Michelle Adams A. Temiloluwa Laredo MD; 04/13/2017 3:01 PM) Patient words: Patient sent for  abnormality in the right breast  Found  on a mammographic abnormality in the right breast. The patient denies any symptoms   prior to intervention. A 1.1 cm mass was located in the right breast upper outer quadrant Core biopsy showed invasive ductal carcinoma grade 2 ER positive PR negative HER-2/neu negative. K I 67 with 40%.  The patient is a 51 year old female.   Past Surgical History Michelle Pummel, RN; 04/13/2017 7:36 AM) Breast Biopsy  Bilateral. Breast Mass; Local Excision  Left. Cesarean Section - 1   Diagnostic Studies History Michelle Pummel, RN; 04/13/2017 7:36 AM) Colonoscopy  1-5 years ago Mammogram  within last year Pap Smear  1-5 years ago  Social History Michelle Pummel, RN; 04/13/2017 7:36 AM) Alcohol use  Occasional alcohol use. Caffeine use  Carbonated beverages, Coffee. No drug use  Tobacco use  Never smoker.  Family History Michelle Pummel, RN; 04/13/2017 7:36 AM) Arthritis  Mother. Diabetes Mellitus  Father. Heart Disease  Daughter. Hypertension  Mother.  Pregnancy / Birth History Michelle Pummel, RN; 04/13/2017 7:36 AM) Age at menarche  27 years. Age of menopause  [age] Contraceptive History  Oral contraceptives. Gravida  3 Irregular periods  Length (months) of breastfeeding  3-6 Maternal age  52-30 Para  2  Other Problems Michelle Pummel, RN; 04/13/2017 7:36 AM) Anxiety Disorder  Asthma  Chest pain  Gastroesophageal Reflux Disease  General anesthesia - complications  Hypercholesterolemia  Lump In Breast  Migraine Headache     Review of Systems Michelle Spillers Ledford RN; 04/13/2017 7:36 AM) General Present- Fatigue. Not Present- Appetite Loss, Chills,  Fever, Night Sweats, Weight Gain and Weight Loss. Skin Present- New Lesions. Not Present- Change in Wart/Mole, Dryness, Hives, Jaundice, Non-Healing Wounds, Rash and Ulcer. HEENT Present- Wears glasses/contact lenses. Not Present- Earache, Hearing Loss, Hoarseness, Nose Bleed, Oral Ulcers, Ringing in the Ears, Seasonal Allergies, Sinus Pain, Sore Throat, Visual Disturbances and Yellow Eyes. Respiratory Present- Snoring. Not Present- Bloody sputum, Chronic Cough, Difficulty Breathing and Wheezing. Breast Present- Breast Mass and Breast Pain. Not Present- Nipple Discharge and Skin Changes. Cardiovascular Present- Difficulty Breathing Lying Down, Leg Cramps, Palpitations, Rapid Heart Rate and Shortness of Breath. Not Present- Chest Pain and Swelling of Extremities. Female Genitourinary Not Present- Frequency, Nocturia, Painful Urination, Pelvic Pain and Urgency. Musculoskeletal Present- Joint Pain. Not Present- Back Pain, Joint Stiffness, Muscle Pain, Muscle Weakness and Swelling of Extremities. Neurological Present- Headaches. Not Present- Decreased Memory, Fainting, Numbness, Seizures, Tingling, Tremor, Trouble walking and Weakness. Psychiatric Present- Anxiety. Not Present- Bipolar, Change in Sleep Pattern, Depression, Fearful and Frequent crying. Endocrine Present- Hot flashes. Not Present- Cold Intolerance, Excessive Hunger, Hair Changes, Heat Intolerance and New Diabetes. Hematology Present- Easy Bruising. Not Present- Blood Thinners, Excessive bleeding, Gland problems, HIV and Persistent Infections.   Physical Exam (Miniya Adams A. Michelle Scaglione MD; 04/13/2017 3:01 PM) General Mental Status-Alert. General Appearance-Consistent with stated age. Hydration-Well hydrated. Voice-Normal.  Head and Neck Head-normocephalic, atraumatic with no lesions or palpable masses. Trachea-midline. Thyroid Gland Characteristics - normal size and consistency.  Breast Note: bruising upper central right  breast noted. No mass. Left breast is normal.   Cardiovascular Cardiovascular examination reveals -normal heart sounds, regular rate and rhythm with no murmurs and normal pedal pulses bilaterally.  Neurologic Neurologic evaluation reveals -  alert and oriented x 3 with no impairment of recent or remote memory. Mental Status-Normal.  Musculoskeletal Normal Exam - Left-Upper Extremity Strength Normal and Lower Extremity Strength Normal. Normal Exam - Right-Upper Extremity Strength Normal and Lower Extremity Strength Normal.  Lymphatic Head & Neck  General Head & Neck Lymphatics: Bilateral - Description - Normal. Axillary  General Axillary Region: Bilateral - Description - Normal. Tenderness - Non Tender.    Assessment & Plan (Annaliz Aven A. Eason Housman MD; 04/13/2017 3:02 PM) BREAST CANCER, RIGHT (C50.911) Impression: Pt has opted for bilateral simple mastectomy and right SLN mapping  The surgical and non surgical options have been discussed with the patient.  Risks of surgery include bleeding,  Infection,  Flap necrosis,  Tissue loss,  Chronic pain, death, Numbness,  And the need for additional procedures.  Reconstruction options also have been discussed with the patient as well.  The patient agrees to proceed.  Sentinel lymph node mapping and dissection has been discussed with the patient.  Risk of bleeding,  Infection,  Seroma formation, arm swelling   Additional procedures,,  Shoulder weakness ,  Shoulder stiffness,  Nerve and blood vessel injury and reaction to the mapping dyes have been discussed.  Alternatives to surgery have been discussed with the patient.  The patient agrees to proceed.   Current Plans Pt Education - [facility] Breast Cancer Information Given - [facility] "Breast Journey" Package Pt Education - Pamphlet Given - Breast Biopsy: discussed with patient and provided information.   Signed by Michelle Daniels, MD (04/13/2017 3:03 PM)

## 2017-05-02 NOTE — Progress Notes (Signed)
HPI: Michelle Adams was previously seen in the [place] clinic on 04/19/2017 due to a personal and family history of breast cancer and concerns regarding a hereditary predisposition to cancer. Please refer to our prior cancer genetics clinic note for more information regarding Michelle Adams's medical, social and family histories, and our assessment and recommendations, at the time. Michelle Adams recent genetic test results were disclosed to her, as well as recommendations warranted by these results. These results and recommendations are discussed in more detail below.  CANCER HISTORY:    Malignant neoplasm of upper-outer quadrant of right breast in female, estrogen receptor positive ([place])   04/06/2017 Initial Diagnosis    Screening detected right breast mass ultrasound 11 mm at 11:00 Biopsy: IDC with DCIS, right lymph node biopsy benign, grade 2, ER 60%, PR 0%, Ki-67 40%, HER-2 negative ratio 1, T1c N0 stage IA clinical stage      04/28/2017 Genetic Testing    Patient had genetic testing due to a personal and family history of breast cancer.  The [facility] Breast Cancer STAT panel was ordered.  The STAT Breast cancer panel offered by [facility] includes sequencing and rearrangement analysis for the following 9 genes:  ATM, BRCA1, BRCA2, CDH1, CHEK2, PALB2, PTEN, STK11 and TP53.    Results: Negative, no pathogenic variants identified.  The patient has declined testing of a larger panel at this time.  She will contact genetics if interested in testing for a larger panel.  (no cost within 90 days from test report date).  The date of this test report is 04/28/2017.         FAMILY HISTORY:  We obtained a detailed, 4-generation family history.  Significant diagnoses are listed below: Family History  Problem Relation Age of Onset  . Hypertension Mother   . Hyperlipidemia Mother   . Arthritis Mother   . Diabetes Father   . Hyperlipidemia Father   . Multiple myeloma Father 70  . Heart disease  Maternal Grandmother   . Leukemia Maternal Uncle 11  . Breast cancer Cousin 60       died at 42  . Breast cancer Other 27  . Breast cancer Other 35  . Breast cancer Other   . Leukemia Other        'blood cancer'   Michelle Adams has 2 daughters ages 32 and 72 who have no history of cancer.  Michelle Adams has 2 brothers in their 66's and 28's, and 1 sister who is in her 40's with no history of cancer.  Her sister still has her ovaries an uterus intact.  All of her siblings have children, none with any history of cancer.   Michelle Adams father was diagnosed with multiple myeloma at 21 and is no 92.  Michelle Adams has 31 paternal aunts and uncles. -1 paternal uncle has a daughter who was diagnosed with breast cancer and died in her 10's.   -1 paternal uncle had a granddaugher (patient's cousin once removed) with breast cancer diagnosed in her 65's and a grandson (patient's cousin once removed) who died from a blood cancer.   -26 other aunts and uncles have no history of cancer and are all older than 49 years of age.   Michelle Adams has several other paternal cousins, none with any known history of cancer.  She does report that an extended cousin on her father's side also had breast cancer diagnosed at 50.  Michelle Adams paternal grandfather and grandmother died in their 73's/80's  with no history of cancer.    Michelle Adams mother is 26 with no history of cancer.  She has her uterus and ovaries intact. Michelle Adams has 4 maternal aunts and 2 maternal uncles: -1 maternal uncle is in his 77's/70's with no history of cancer. -1 maternal uncle died at 17 years of age due to leukemia.   -4 maternal aunts are in their 76's and 64's with no history of cancer.  Michelle Adams has several maternal cousins, none with any known history of cancer.  She does report that a maternal second cousin also had breast cancer.  Michelle Adams maternal grandfather died in his 16's with no history .  Her maternal grandmother died at 42 due to  heart disease, and had no history of cancer.   Michelle Adams is unaware of previous family history of genetic testing for hereditary cancer risks. Patient's maternal ancestors are of Asian/Pacific Islander descent, and paternal ancestors are of Asian/Pacific Islander descent. There is no reported Ashkenazi Jewish ancestry. There is no known consanguinity.  GENETIC TEST RESULTS: Genetic testing performed through [facility]'s Breast Cancer STAT panel reported out on 04/28/2017 showed no pathogenic mutations. The STAT Breast cancer panel offered by [facility] includes sequencing and rearrangement analysis for the following 9 genes:  ATM, BRCA1, BRCA2, CDH1, CHEK2, PALB2, PTEN, STK11 and TP53.  .  The test report will be scanned into EPIC and will be located under the Molecular Pathology section of the Results Review tab.A portion of the result report is included below for reference.     We discussed with Michelle Adams that because current genetic testing is not perfect, it is possible there may be a gene mutation in one of these genes that current testing cannot detect, but that chance is small. We also discussed, that there could be another gene that has not yet been discovered, or that we have not yet tested, that is responsible for the cancer diagnoses in the family. Therefore, it is important to remain in touch with cancer genetics in the future so that we can continue to offer Michelle Adams the most up to date genetic testing.   ADDITIONAL GENETIC TESTING: We discussed with Michelle Adams that there are other genes that are associated with increased cancer risk that can be analyzed. The laboratories that offer this testing look at these additional genes via a hereditary cancer gene panel. Should Michelle Adams wish to pursue additional genetic testing, we are happy to discuss and coordinate this testing, at any time.    These results include the genes for which, if positive, may change breast surgery recommendations.   However, I recommended that Michelle. Kimmons have reflex genetic testing for a more complete panel (Common Hereditary Cancer Panel 47 genes total)  Michelle. Asch has declined at this time and would like to think about the decision to pursue further genetic testing, and will contact us in genetics if she is interested in having a larger panel of genetic testing.   CANCER SCREENING RECOMMENDATIONS: This normal result indicates that it is unlikely Michelle. Donlon has an increased risk of cancer due to a mutation in one of these genes.  Therefore, Michelle. Salvador was advised to continue following the cancer screening guidelines provided by her primary healthcare providers. Other factors such as her personal and family history may still affect her cancer risk.     RECOMMENDATIONS FOR FAMILY MEMBERS: Women in this family might be at some increased risk of developing cancer, over the general population  risk, simply due to the family history of cancer. We recommended women in this family have a yearly mammogram beginning at age 91, or 53 years younger than the earliest onset of cancer, an annual clinical breast exam, and perform monthly breast self-exams. We recommend her daughters have routine breast screening starting by age 60.  Women in this family should inform their doctors about the family history of cancer so they can assess their individual risk and make appropriate recommendations.   Women in this family should also have a gynecological exam as recommended by their primary provider. All family members should have a colonoscopy by age 29.  Based on Michelle. Freel's family history, we recommended her maternal cousin once removed, who was diagnosed with breast cancer in her 41's, have genetic counseling and testing. Michelle. Lawhorne will let us know if we can be of any assistance in coordinating genetic counseling and/or testing for this family member.   FOLLOW-UP: Lastly, we discussed with Michelle. Rieth that cancer genetics is a rapidly  advancing field and it is possible that new genetic tests will be appropriate for her and/or her family members in the future. We encouraged her to remain in contact with cancer genetics on an annual basis so we can update her personal and family histories and let her know of advances in cancer genetics that may benefit this family.   Our contact number was provided. Michelle. Senteno questions were answered to her satisfaction, and she knows she is welcome to call us at anytime with additional questions or concerns.   Ferol Luz, Michelle Genetic Counselor  [email or web address]

## 2017-05-04 ENCOUNTER — Ambulatory Visit (HOSPITAL_BASED_OUTPATIENT_CLINIC_OR_DEPARTMENT_OTHER): Admission: RE | Admit: 2017-05-04 | Payer: 59 | Source: Ambulatory Visit | Admitting: Surgery

## 2017-05-04 ENCOUNTER — Encounter (HOSPITAL_COMMUNITY): Payer: 59

## 2017-05-04 SURGERY — BREAST LUMPECTOMY WITH RADIOACTIVE SEED AND SENTINEL LYMPH NODE BIOPSY
Anesthesia: General | Site: Breast | Laterality: Right

## 2017-05-10 ENCOUNTER — Telehealth: Payer: Self-pay | Admitting: *Deleted

## 2017-05-10 NOTE — Telephone Encounter (Signed)
Received call from patient stating she would like a 2nd opinion regarding her diagnosis.  She doesn't want to see other physicians, she just wants another radiologist to read her mammogram, another biopsy and another pathologist.  Informed her she would have to go outside [facility] for 2nd opinion.  She would like [facility].  Appointment made wit Dr. Elisha Headland for 11/9 at [facility].  Informed her she would have to see the surgical oncologist for their opinion on 2nd biopsy.  Patient aware of appointment.

## 2017-05-12 ENCOUNTER — Telehealth: Payer: Self-pay

## 2017-05-12 NOTE — Telephone Encounter (Signed)
Incoming fax from [facility] requesting genetics results and path results with ER, PR, HER2 results.  Faxed.

## 2017-05-13 ENCOUNTER — Telehealth: Payer: Self-pay | Admitting: Hematology and Oncology

## 2017-05-13 NOTE — Telephone Encounter (Signed)
Scheduled appt per 11/7 sch message - MD not here on 11/27 scheduled appt for 11/28 - patient is aware of appt date and time.

## 2017-05-17 ENCOUNTER — Telehealth: Payer: Self-pay | Admitting: *Deleted

## 2017-05-17 NOTE — Telephone Encounter (Signed)
Received call from patient stating she will be receiving all her care at [facility].  Appointment with Dr. Lindi Adie will be cancelled.  Encouraged her to call if she needs anything in the future.

## 2017-05-24 ENCOUNTER — Encounter (HOSPITAL_BASED_OUTPATIENT_CLINIC_OR_DEPARTMENT_OTHER): Payer: Self-pay

## 2017-05-24 ENCOUNTER — Encounter (HOSPITAL_COMMUNITY): Payer: 59

## 2017-05-24 ENCOUNTER — Ambulatory Visit (HOSPITAL_BASED_OUTPATIENT_CLINIC_OR_DEPARTMENT_OTHER): Admit: 2017-05-24 | Payer: 59 | Admitting: Surgery

## 2017-05-24 SURGERY — SIMPLE MASTECTOMY WITH AXILLARY SENTINEL NODE BIOPSY
Anesthesia: General | Site: Breast | Laterality: Bilateral

## 2017-06-01 ENCOUNTER — Ambulatory Visit: Payer: 59 | Admitting: Hematology and Oncology

## 2017-07-04 ENCOUNTER — Telehealth: Payer: Self-pay | Admitting: Hematology and Oncology

## 2017-07-04 NOTE — Telephone Encounter (Signed)
Faxed records to [facility] °

## 2017-08-26 ENCOUNTER — Other Ambulatory Visit: Payer: Self-pay | Admitting: Family Medicine

## 2017-08-26 DIAGNOSIS — R945 Abnormal results of liver function studies: Principal | ICD-10-CM

## 2017-08-26 DIAGNOSIS — R17 Unspecified jaundice: Secondary | ICD-10-CM

## 2017-08-26 DIAGNOSIS — R7989 Other specified abnormal findings of blood chemistry: Secondary | ICD-10-CM

## 2017-09-07 ENCOUNTER — Other Ambulatory Visit: Payer: 59

## 2017-09-14 ENCOUNTER — Ambulatory Visit
Admission: RE | Admit: 2017-09-14 | Discharge: 2017-09-14 | Disposition: A | Discharge status: Home / Self Care | Payer: Managed Care, Other (non HMO) | Source: Ambulatory Visit | Attending: Family Medicine | Admitting: Family Medicine

## 2017-09-14 DIAGNOSIS — R7989 Other specified abnormal findings of blood chemistry: Secondary | ICD-10-CM

## 2017-09-14 DIAGNOSIS — R17 Unspecified jaundice: Secondary | ICD-10-CM

## 2017-09-14 DIAGNOSIS — R945 Abnormal results of liver function studies: Principal | ICD-10-CM

## 2018-07-11 ENCOUNTER — Other Ambulatory Visit (HOSPITAL_COMMUNITY)
Admission: RE | Admit: 2018-07-11 | Discharge: 2018-07-11 | Disposition: A | Discharge status: Home / Self Care | Payer: Managed Care, Other (non HMO) | Source: Ambulatory Visit | Attending: Family Medicine | Admitting: Family Medicine

## 2018-07-11 ENCOUNTER — Other Ambulatory Visit: Payer: Self-pay | Admitting: Family Medicine

## 2018-07-11 DIAGNOSIS — Z01411 Encounter for gynecological examination (general) (routine) with abnormal findings: Secondary | ICD-10-CM | POA: Insufficient documentation

## 2018-07-13 LAB — CYTOLOGY - PAP
DIAGNOSIS: NEGATIVE
HPV (WINDOPATH): NOT DETECTED

## 2018-07-25 IMAGING — MR MR BILATERAL BREAST WITHOUT AND WITH CONTRAST
6 of 15 series · 13 of 48 positions shown · IV contrast (multihance)
Comparison: Prior mammograms as well as MRI dated 03/10/2010.

CLINICAL DATA: Patient with recent diagnosis right breast
carcinoma.

EXAM:
BILATERAL BREAST MRI WITH AND WITHOUT CONTRAST
TECHNIQUE: Multiplanar, multisequence MR images of both breasts were obtained
prior to and following the intravenous administration of 14 ml of
MultiHance.

[Series 1: (phone_number) · axial · 7.0mm · 1.56mm/px · 1 of 25 slices shown]
[im 1/25]
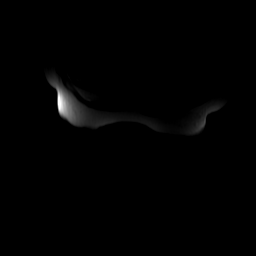

[Series 4: ax ir · axial · 3.0mm · 0.70mm/px · 1 of 74 slices shown]
[im 1/74]
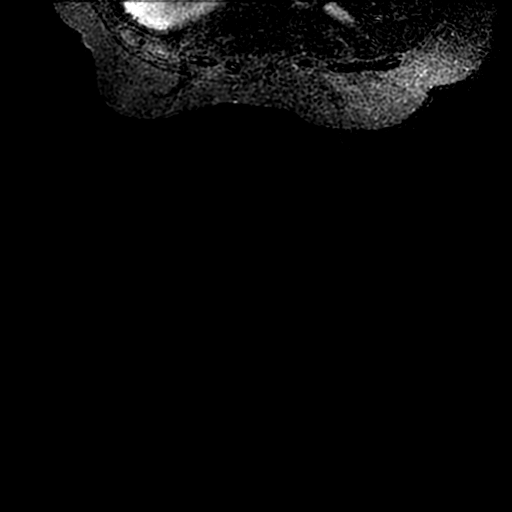

[Series 5: ax ir repeat · axial · 3.0mm · 0.70mm/px · 1 of 74 slices shown]
[im 1/74]
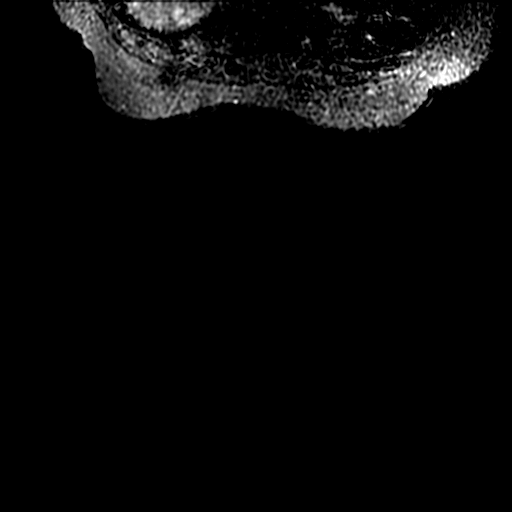

[Series 500: dynacad 2 capture · axial · 0.35mm/px · 1 of 15 slices shown]
[im 1/15]
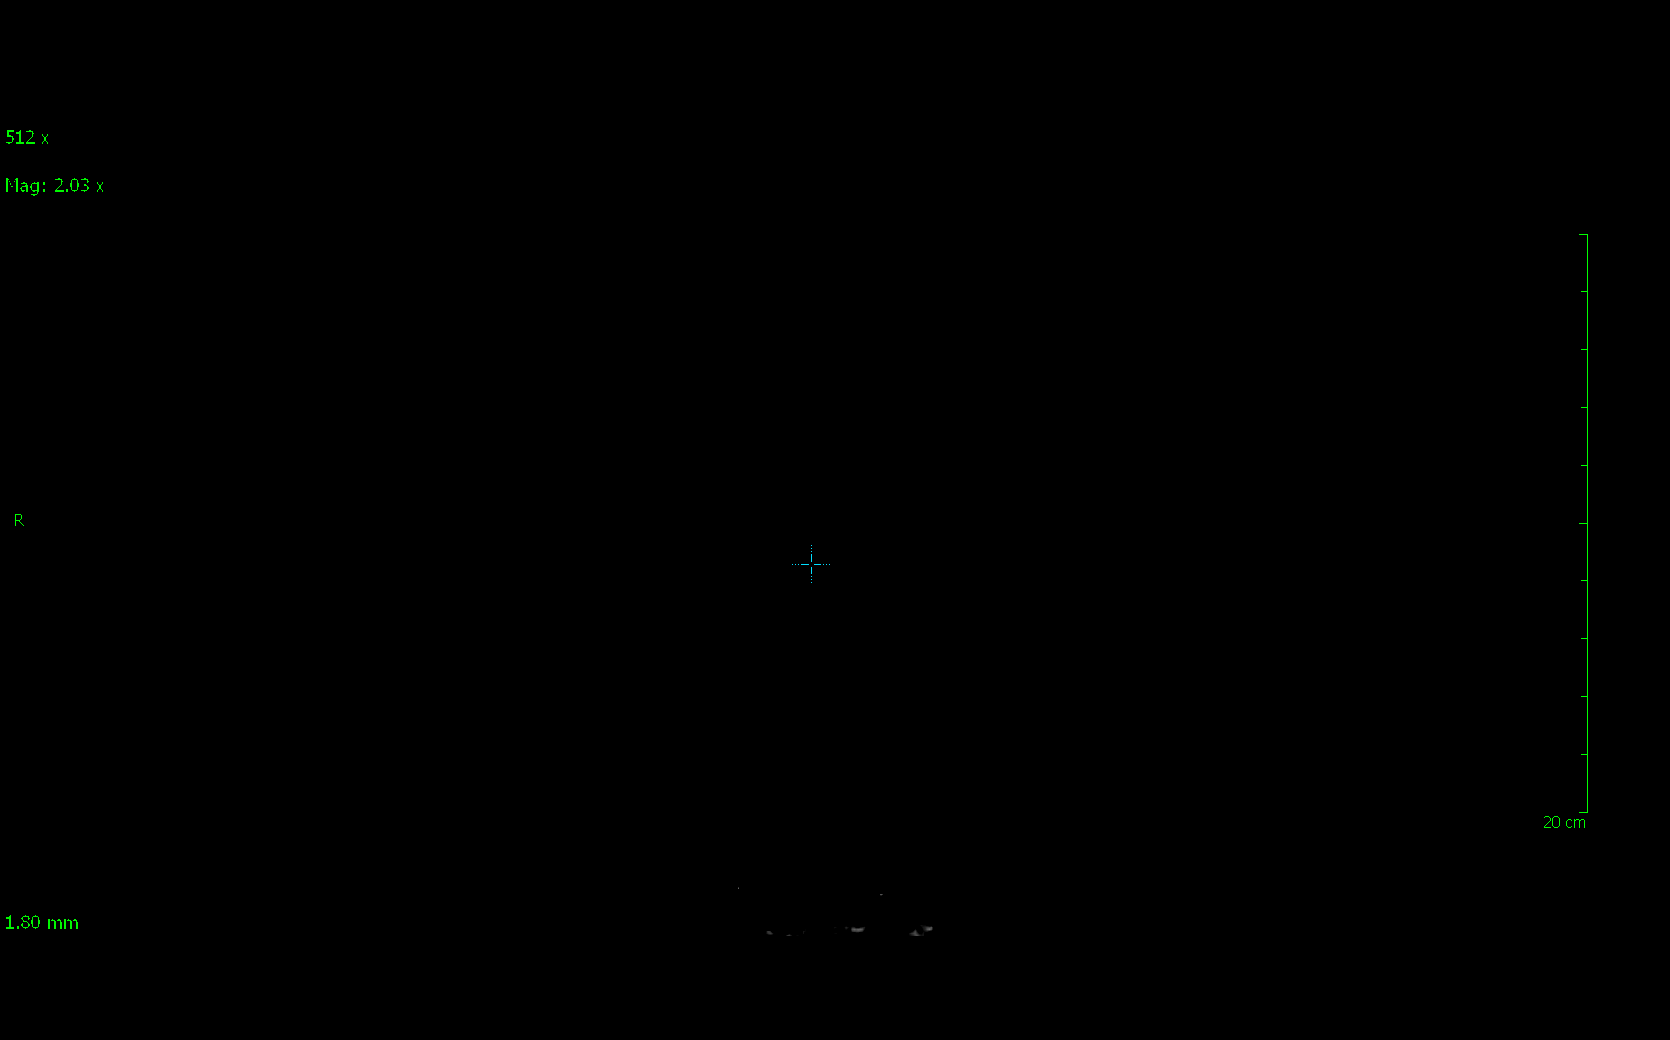

[Series 700: vibrant mph +c · axial · 1.8mm · 0.70mm/px · z∈[-101,+118]mm · 5 of 244 slices shown]
[im 1/244]
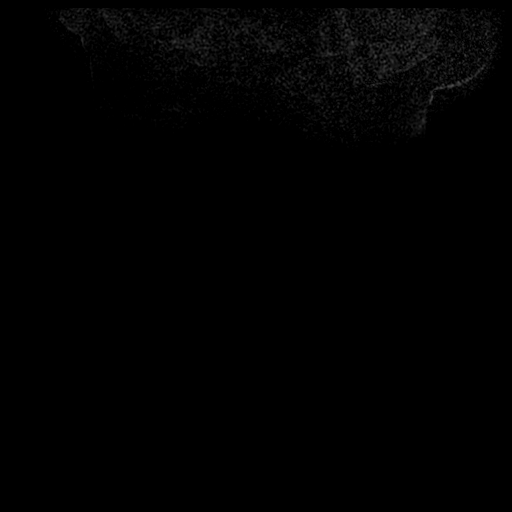
[im 61/244]
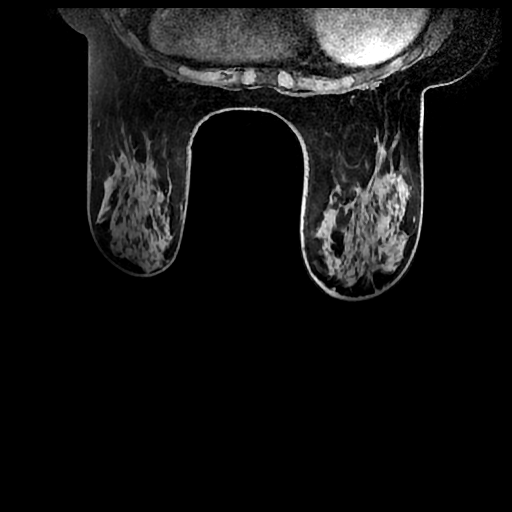
[im 122/244]
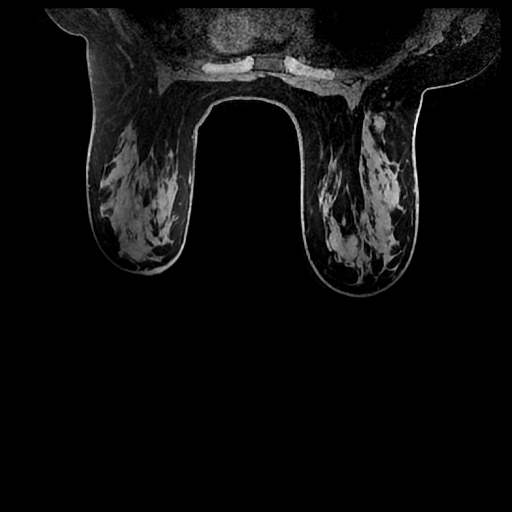
[im 183/244]
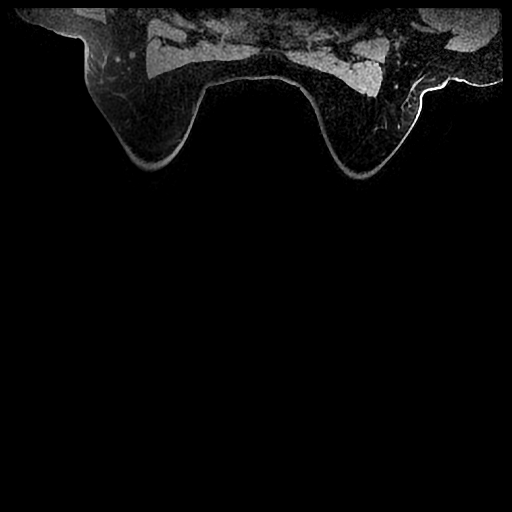
[im 244/244]
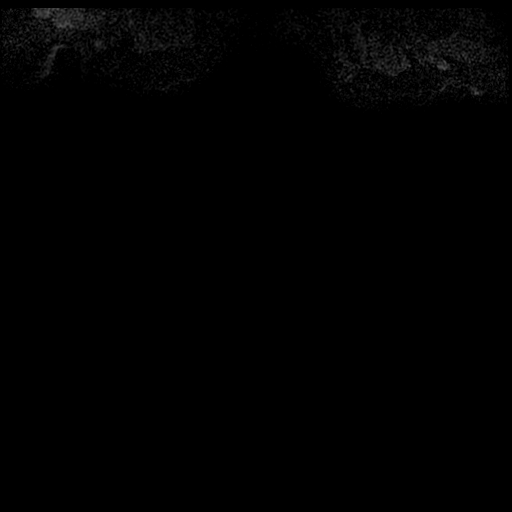

[Series 701: ph1/ vibrant mph · axial · 1.8mm · 0.70mm/px · z∈[-101,+63]mm · 4 of 244 slices shown]
[im 1/244]
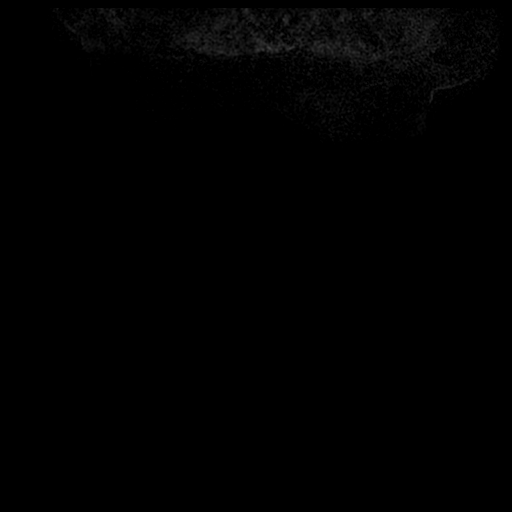
[im 61/244]
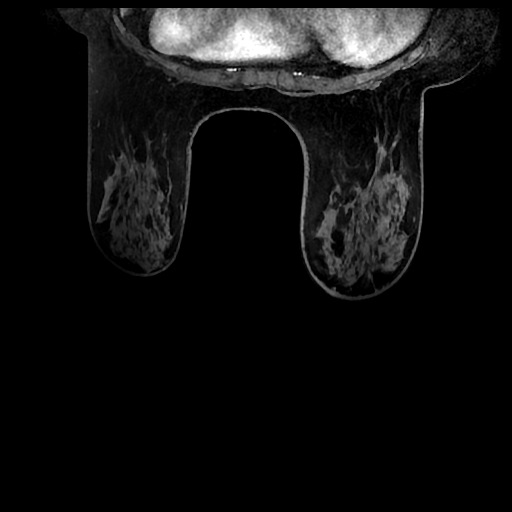
[im 122/244]
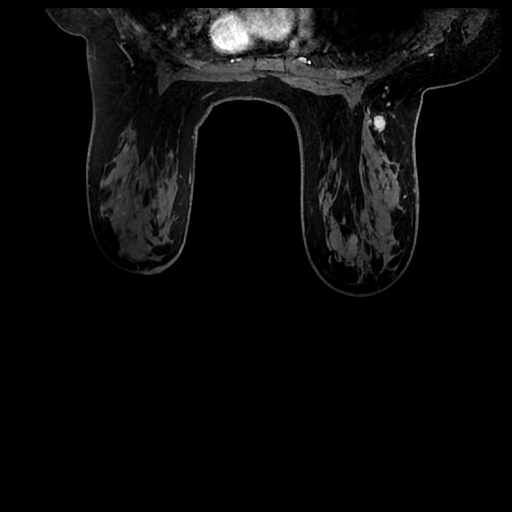
[im 183/244]
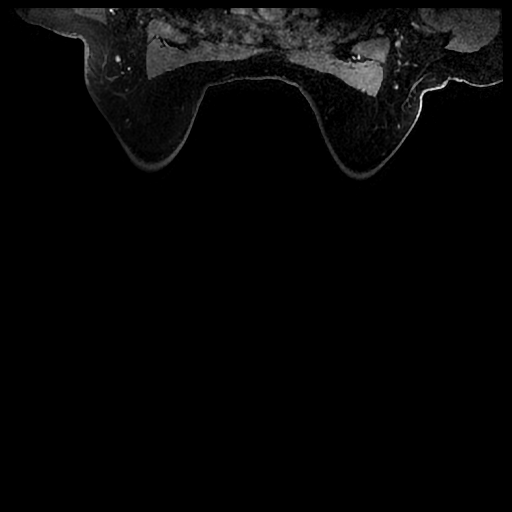

[13 of 48 positions shown; findings below may reference images not displayed]

THREE-DIMENSIONAL MR IMAGE RENDERING ON INDEPENDENT WORKSTATION:

Three-dimensional MR images were rendered by post-processing of the
original MR data on an independent workstation. The
three-dimensional MR images were interpreted, and findings are
reported in the following complete MRI report for this study. Three
dimensional images were evaluated at the independent DynaCad
workstation
FINDINGS: Breast composition: c. Heterogeneous fibroglandular tissue.

Background parenchymal enhancement: Mild

Right breast: Within the upper-outer right breast posterior depth
there is a 1.4 x 1.2 x 1.5 cm lobular enhancing mass compatible with
biopsy-proven carcinoma. Superior and lateral to this is a 9 mm
intramammary lymph node containing a biopsy marking clip, compatible
with recently biopsied node.

Within the upper inner right breast middle depth there is a 1.0 x
1.1 x 1.0 cm lobular enhancing mass.

Within the upper-outer right breast inferior to the recently
biopsied mass is a 1.0 cm lobular enhancing mass, potentially
representing a cortically thickened intramammary lymph node.

Left breast: Within the lower outer left breast middle depth there
is a 7 mm rounded enhancing mass.

Within the lower outer left breast posterior depth there is a 1.4 cm
area of linear non mass enhancement.

Lymph nodes: No definite enlarged axillary lymph nodes are
identified.

Ancillary findings:  None.
IMPRESSION: 1. Biopsy-proven malignancy within the upper-outer right breast.
2. Adjacent benign lymph node, previously biopsied within the
upper-outer right breast posterior depth.
3. Indeterminate enhancing mass within the upper-outer right breast
posterior depth, potentially representing a cortically thickened
intramammary lymph node.
4. Indeterminate enhancing mass within the upper inner right breast
middle depth.
5. Indeterminate enhancing mass lower outer left breast.
6. Indeterminate linear non mass enhancement lower outer left
breast.

RECOMMENDATION:
1. Second-look ultrasound to evaluate the indeterminate enhancing
mass within the upper-outer right breast posterior depth,
indeterminate enhancing mass within the upper inner right breast
middle depth and enhancing mass within the lower outer left breast
middle depth.
2. MRI guided core needle biopsy of the linear non mass enhancement
within the lower outer left breast middle depth.
3. Continued treatment plan for known right breast malignancy.

BI-RADS CATEGORY  4: Suspicious.

## 2019-01-31 ENCOUNTER — Other Ambulatory Visit: Payer: Self-pay | Admitting: Family Medicine

## 2019-01-31 DIAGNOSIS — R1011 Right upper quadrant pain: Secondary | ICD-10-CM

## 2019-02-08 ENCOUNTER — Other Ambulatory Visit: Payer: Managed Care, Other (non HMO)

## 2019-02-23 ENCOUNTER — Other Ambulatory Visit: Payer: Self-pay

## 2019-04-13 ENCOUNTER — Other Ambulatory Visit: Payer: Self-pay

## 2019-04-16 ENCOUNTER — Ambulatory Visit
Admission: RE | Admit: 2019-04-16 | Discharge: 2019-04-16 | Disposition: A | Discharge status: Home / Self Care | Payer: 59 | Source: Ambulatory Visit | Attending: Family Medicine | Admitting: Family Medicine

## 2019-04-16 ENCOUNTER — Other Ambulatory Visit: Payer: Self-pay | Admitting: Family Medicine

## 2019-04-16 DIAGNOSIS — R1011 Right upper quadrant pain: Secondary | ICD-10-CM

## 2019-04-25 ENCOUNTER — Other Ambulatory Visit: Payer: Self-pay | Admitting: Family Medicine

## 2019-04-27 ENCOUNTER — Other Ambulatory Visit: Payer: Self-pay

## 2019-04-27 DIAGNOSIS — Z20822 Contact with and (suspected) exposure to covid-19: Secondary | ICD-10-CM

## 2019-04-28 LAB — NOVEL CORONAVIRUS, NAA: SARS-CoV-2, NAA: NOT DETECTED

## 2024-02-10 ENCOUNTER — Encounter: Payer: Self-pay | Admitting: Podiatry

## 2024-02-10 ENCOUNTER — Telehealth: Payer: Self-pay | Admitting: *Deleted

## 2024-02-10 ENCOUNTER — Ambulatory Visit: Admitting: Podiatry

## 2024-02-10 ENCOUNTER — Ambulatory Visit (INDEPENDENT_AMBULATORY_CARE_PROVIDER_SITE_OTHER)

## 2024-02-10 DIAGNOSIS — M79671 Pain in right foot: Secondary | ICD-10-CM | POA: Diagnosis not present

## 2024-02-10 DIAGNOSIS — M79672 Pain in left foot: Secondary | ICD-10-CM | POA: Diagnosis not present

## 2024-02-10 DIAGNOSIS — M722 Plantar fascial fibromatosis: Secondary | ICD-10-CM

## 2024-02-10 MED ORDER — MELOXICAM 15 MG PO TABS: 15.0000 mg | ORAL_TABLET | Freq: Every day | ORAL | 0 refills | Status: AC

## 2024-02-10 NOTE — Progress Notes (Signed)
  Subjective:  Patient ID: Michelle Adams, female    DOB: [birth year],   MRN: [id number]  Chief Complaint  Patient presents with   Foot Pain    This right foot is my main issue, the heel. The left heel hurts too.  I sprained my ankle two years ago.  Sometime it hurts.  I have numbness on my feet and tingling in my toes.     58 y.o. female presents for concern mostly of her right heel and some pain in the left heel. She relates numbness and tingling in the toes. States first steps in the morning are difficult. She has tried taking some anti-inflammatories which help but does not want to be reliant. She also relates some pains coming down her legs from her back and relates some numbness in her toes.   . Denies any other pedal complaints. Denies n/v/f/c.   Past Medical History:  Diagnosis Date   Anemia    Low iron - to take ferrous gluconate   Asthma    Breast cancer (HCC)    Family history of breast cancer    Migraine     Objective:  Physical Exam: Vascular: DP/PT pulses 2/4 bilateral. CFT <3 seconds. Normal hair growth on digits. No edema.  Skin. No lacerations or abrasions bilateral feet.  Musculoskeletal: MMT 5/5 bilateral lower extremities in DF, PF, Inversion and Eversion. Deceased ROM in DF of ankle joint. Tender to the medial calcaneal tubercle bilaterally . No pain with achilles, PT or arch. No pain with calcaneal squeeze.  Neurological: Sensation intact to light touch.   Assessment:   1. Plantar fasciitis, bilateral      Plan:  Patient was evaluated and treated and all questions answered. Discussed plantar fasciitis with patient.  X-rays reviewed and discussed with patient. No acute fractures or dislocations noted. Mild spurring noted at inferior calcaneus.  Discussed treatment options including, ice, NSAIDS, supportive shoes, bracing, and stretching. Stretching exercises provided to be done on a daily basis.   Prescription for meloxicam  provided and sent to pharmacy.  Previous kidney function labs wnl.  PF brace dispensed bilateral.  Follow-up 6 weeks or sooner if any problems arise. In the meantime, encouraged to call the office with any questions, concerns, change in symptoms.     Asberry Failing, DPM

## 2024-02-10 NOTE — Patient Instructions (Signed)

## 2024-02-10 NOTE — Telephone Encounter (Signed)
 I called and asked her to come in a few minutes early because we need xrays of her feet.  Upon her arrival, we will direct her to imaging.

## 2024-02-14 ENCOUNTER — Telehealth: Payer: Self-pay | Admitting: Podiatry

## 2024-02-14 NOTE — Telephone Encounter (Signed)
 Patient is prescribed meloxicam  (Mobic ) 15 mg tablets. She would like to know if she can take Tylenol (acetaminophen) along with this medication. If so, please provide the recommended strength, dosage, and frequency.

## 2024-03-23 ENCOUNTER — Ambulatory Visit (INDEPENDENT_AMBULATORY_CARE_PROVIDER_SITE_OTHER): Admitting: Podiatry

## 2024-03-23 ENCOUNTER — Ambulatory Visit: Admitting: Podiatry

## 2024-03-23 ENCOUNTER — Encounter: Payer: Self-pay | Admitting: Podiatry

## 2024-03-23 DIAGNOSIS — M722 Plantar fascial fibromatosis: Secondary | ICD-10-CM

## 2024-03-23 MED ORDER — TRIAMCINOLONE ACETONIDE 10 MG/ML IJ SUSP
2.5000 mg | Freq: Once | INTRAMUSCULAR | Status: AC
  Administered 2024-03-23: 2.5 mg via INTRA_ARTICULAR

## 2024-03-23 MED ORDER — METHYLPREDNISOLONE 4 MG PO TBPK: ORAL_TABLET | ORAL | 0 refills | Status: AC

## 2024-03-23 MED ORDER — DEXAMETHASONE SODIUM PHOSPHATE 120 MG/30ML IJ SOLN
4.0000 mg | Freq: Once | INTRAMUSCULAR | Status: AC
  Administered 2024-03-23: 4 mg via INTRA_ARTICULAR

## 2024-03-23 NOTE — Progress Notes (Signed)
  Subjective:  Patient ID: Michelle Adams, female    DOB: 04-04-1966,   MRN: [id number]  Chief Complaint  Patient presents with   Plantar Fasciitis    They're better.    58 y.o. female presents forfoillow-up of bilateral plantar fasciitis. Relates doing better about 50%. She is still getting some pain. She has been stretching and wearing brace and taking aleve which works best. She has a trip to the [place] coming up and hoping to be better by then  . Denies any other pedal complaints. Denies n/v/f/c.   Past Medical History:  Diagnosis Date   Anemia    Low iron - to take ferrous gluconate   Asthma    Breast cancer (HCC)    Family history of breast cancer    Migraine     Objective:  Physical Exam: Vascular: DP/PT pulses 2/4 bilateral. CFT <3 seconds. Normal hair growth on digits. No edema.  Skin. No lacerations or abrasions bilateral feet.  Musculoskeletal: MMT 5/5 bilateral lower extremities in DF, PF, Inversion and Eversion. Deceased ROM in DF of ankle joint. Tender to the medial calcaneal tubercle bilaterally . No pain with achilles, PT or arch. No pain with calcaneal squeeze.  Neurological: Sensation intact to light touch.   Assessment:   1. Plantar fasciitis, bilateral       Plan:  Patient was evaluated and treated and all questions answered. Discussed plantar fasciitis with patient.  X-rays reviewed and discussed with patient. No acute fractures or dislocations noted. Mild spurring noted at inferior calcaneus.  Discussed treatment options including, ice, NSAIDS, supportive shoes, bracing, and stretching.  Continue brace and stretching  Continue anti-inflamamtoires  Injection offered today. Procedure below.  Medrol  dose pack sent for upcoming trip in case of need Follow-up 6 weeks or sooner if any problems arise. In the meantime, encouraged to call the office with any questions, concerns, change in symptoms.   Procedure: Injection Tendon/Ligament Discussed  alternatives, risks, complications and verbal consent was obtained.  Location: Bilateral plantar fascia . Skin Prep: Alcohol. Injectate: 1cc 0.5% marcaine plain, 1 cc dexamethasone  0.5 cc kenalog   Disposition: Patient tolerated procedure well. Injection site dressed with a band-aid.  Post-injection care was discussed and return precautions discussed.    Asberry Failing, DPM

## 2024-04-20 ENCOUNTER — Encounter (HOSPITAL_BASED_OUTPATIENT_CLINIC_OR_DEPARTMENT_OTHER): Payer: Self-pay

## 2024-04-20 ENCOUNTER — Encounter (HOSPITAL_BASED_OUTPATIENT_CLINIC_OR_DEPARTMENT_OTHER): Payer: Self-pay | Admitting: Radiation Oncology

## 2024-04-20 ENCOUNTER — Other Ambulatory Visit (HOSPITAL_BASED_OUTPATIENT_CLINIC_OR_DEPARTMENT_OTHER): Payer: Self-pay

## 2024-04-20 DIAGNOSIS — Z853 Personal history of malignant neoplasm of breast: Secondary | ICD-10-CM

## 2024-05-18 ENCOUNTER — Ambulatory Visit: Admitting: Podiatry

## 2024-05-18 DIAGNOSIS — Z91199 Patient's noncompliance with other medical treatment and regimen due to unspecified reason: Secondary | ICD-10-CM

## 2024-05-18 NOTE — Progress Notes (Signed)
 No show

## 2024-06-14 ENCOUNTER — Inpatient Hospital Stay (HOSPITAL_BASED_OUTPATIENT_CLINIC_OR_DEPARTMENT_OTHER): Admission: RE | Admit: 2024-06-14 | Discharge: 2024-06-14

## 2024-06-14 DIAGNOSIS — Z853 Personal history of malignant neoplasm of breast: Secondary | ICD-10-CM | POA: Insufficient documentation
